# Patient Record
Sex: Female | Born: 1950 | Race: Black or African American | Hispanic: No | Smoking: Former smoker
Health system: Southern US, Community
[De-identification: ages and names within clinical notes are randomized; demographics above are authoritative.]

## PROBLEM LIST (undated history)

## (undated) DIAGNOSIS — E049 Nontoxic goiter, unspecified: Secondary | ICD-10-CM

## (undated) DIAGNOSIS — R112 Nausea with vomiting, unspecified: Secondary | ICD-10-CM

## (undated) DIAGNOSIS — K219 Gastro-esophageal reflux disease without esophagitis: Secondary | ICD-10-CM

## (undated) DIAGNOSIS — T4145XA Adverse effect of unspecified anesthetic, initial encounter: Secondary | ICD-10-CM

## (undated) DIAGNOSIS — D649 Anemia, unspecified: Secondary | ICD-10-CM

## (undated) DIAGNOSIS — I10 Essential (primary) hypertension: Secondary | ICD-10-CM

## (undated) DIAGNOSIS — M1990 Unspecified osteoarthritis, unspecified site: Secondary | ICD-10-CM

## (undated) DIAGNOSIS — Z9889 Other specified postprocedural states: Secondary | ICD-10-CM

## (undated) DIAGNOSIS — C801 Malignant (primary) neoplasm, unspecified: Secondary | ICD-10-CM

## (undated) DIAGNOSIS — T8859XA Other complications of anesthesia, initial encounter: Secondary | ICD-10-CM

## (undated) HISTORY — PX: KNEE SURGERY: SHX244

## (undated) HISTORY — PX: ABDOMINAL HYSTERECTOMY: SHX81

## (undated) HISTORY — PX: HIP SURGERY: SHX245

## (undated) HISTORY — PX: BREAST SURGERY: SHX581

## (undated) HISTORY — PX: COLONOSCOPY: SHX174

## (undated) HISTORY — PX: COLON SURGERY: SHX602

## (undated) HISTORY — PX: BACK SURGERY: SHX140

## (undated) HISTORY — PX: JOINT REPLACEMENT: SHX530

## (undated) HISTORY — PX: MASTECTOMY: SHX3

---

## 2005-06-01 ENCOUNTER — Ambulatory Visit: Payer: Self-pay | Admitting: Oncology

## 2005-06-13 ENCOUNTER — Ambulatory Visit: Payer: Self-pay | Admitting: Oncology

## 2005-08-15 ENCOUNTER — Ambulatory Visit: Payer: Self-pay | Admitting: Oncology

## 2006-11-14 ENCOUNTER — Ambulatory Visit: Payer: Self-pay | Admitting: Oncology

## 2006-12-15 ENCOUNTER — Ambulatory Visit: Payer: Self-pay | Admitting: Oncology

## 2007-01-02 ENCOUNTER — Ambulatory Visit: Payer: Self-pay | Admitting: Oncology

## 2007-01-14 ENCOUNTER — Ambulatory Visit: Payer: Self-pay | Admitting: Oncology

## 2007-09-30 ENCOUNTER — Ambulatory Visit: Payer: Self-pay | Admitting: Oncology

## 2007-10-02 ENCOUNTER — Ambulatory Visit: Payer: Self-pay | Admitting: Oncology

## 2009-05-05 ENCOUNTER — Emergency Department: Payer: Self-pay | Admitting: Emergency Medicine

## 2009-08-09 ENCOUNTER — Encounter: Payer: Self-pay | Admitting: Orthopedic Surgery

## 2009-08-13 ENCOUNTER — Encounter: Payer: Self-pay | Admitting: Orthopedic Surgery

## 2009-09-13 ENCOUNTER — Encounter: Payer: Self-pay | Admitting: Orthopedic Surgery

## 2010-08-08 ENCOUNTER — Encounter: Payer: Self-pay | Admitting: Orthopedic Surgery

## 2010-08-14 ENCOUNTER — Encounter: Payer: Self-pay | Admitting: Orthopedic Surgery

## 2010-09-14 ENCOUNTER — Encounter: Payer: Self-pay | Admitting: Orthopedic Surgery

## 2011-02-18 ENCOUNTER — Other Ambulatory Visit: Payer: Self-pay

## 2011-02-18 LAB — BASIC METABOLIC PANEL
Calcium, Total: 8.5 mg/dL (ref 8.5–10.1)
Chloride: 106 mmol/L (ref 98–107)
Creatinine: 0.84 mg/dL (ref 0.60–1.30)
EGFR (African American): >60
EGFR (Non-African Amer.): >60
Glucose: 101 mg/dL — ABNORMAL HIGH (ref 65–99)
Osmolality: 288 (ref 275–301)
Potassium: 4.5 mmol/L (ref 3.5–5.1)
Sodium: 144 mmol/L (ref 136–145)

## 2011-02-19 ENCOUNTER — Other Ambulatory Visit: Payer: Self-pay

## 2011-02-19 LAB — BASIC METABOLIC PANEL
BUN: 15 mg/dL (ref 7–18)
Chloride: 104 mmol/L (ref 98–107)
Creatinine: 0.77 mg/dL (ref 0.60–1.30)
EGFR (African American): >60
EGFR (Non-African Amer.): >60
Glucose: 83 mg/dL (ref 65–99)
Osmolality: 285 (ref 275–301)
Potassium: 4.4 mmol/L (ref 3.5–5.1)
Sodium: 143 mmol/L (ref 136–145)

## 2011-06-23 ENCOUNTER — Emergency Department: Payer: Self-pay | Admitting: Internal Medicine

## 2011-06-23 LAB — COMPREHENSIVE METABOLIC PANEL
Albumin: 3.2 g/dL — ABNORMAL LOW (ref 3.4–5.0)
Bilirubin,Total: 0.3 mg/dL (ref 0.2–1.0)
Chloride: 104 mmol/L (ref 98–107)
Co2: 30 mmol/L (ref 21–32)
Creatinine: 0.71 mg/dL (ref 0.60–1.30)
EGFR (African American): >60
Osmolality: 279 (ref 275–301)
SGOT(AST): 14 U/L — ABNORMAL LOW (ref 15–37)
SGPT (ALT): 11 U/L — ABNORMAL LOW
Sodium: 141 mmol/L (ref 136–145)

## 2011-06-23 LAB — CBC
HGB: 11 g/dL — ABNORMAL LOW (ref 12.0–16.0)
MCH: 27.4 pg (ref 26.0–34.0)
MCHC: 32.2 g/dL (ref 32.0–36.0)
Platelet: 331 10*3/uL (ref 150–440)

## 2011-06-28 LAB — CULTURE, BLOOD (SINGLE)

## 2012-03-31 ENCOUNTER — Emergency Department: Payer: Self-pay | Admitting: Emergency Medicine

## 2013-01-01 ENCOUNTER — Emergency Department: Payer: Self-pay | Admitting: Emergency Medicine

## 2013-07-18 ENCOUNTER — Ambulatory Visit (INDEPENDENT_AMBULATORY_CARE_PROVIDER_SITE_OTHER): Payer: Medicare PPO | Admitting: Podiatry

## 2013-07-18 ENCOUNTER — Encounter: Payer: Self-pay | Admitting: Podiatry

## 2013-07-18 ENCOUNTER — Ambulatory Visit (INDEPENDENT_AMBULATORY_CARE_PROVIDER_SITE_OTHER): Payer: Medicare PPO

## 2013-07-18 VITALS — BP 141/80 | HR 70 | Resp 16 | Ht 62.0 in | Wt 240.0 lb

## 2013-07-18 DIAGNOSIS — M2040 Other hammer toe(s) (acquired), unspecified foot: Secondary | ICD-10-CM

## 2013-07-18 DIAGNOSIS — M7750 Other enthesopathy of unspecified foot: Secondary | ICD-10-CM

## 2013-07-18 NOTE — Progress Notes (Signed)
Subjective:     Patient ID: Victoria Crane, female   DOB: [birth year], 63 y.o.   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      Objective:   Physical Exam  Nursing note and vitals reviewed. Constitutional: She is oriented to person, place, and time.  Cardiovascular: Intact distal pulses.   Musculoskeletal: Normal range of motion.  Neurological: She is oriented to person, place, and time.  Skin: Skin is dry.   neurovascular status found to be intact with range of motion adequate subtalar and midtarsal joint and muscle strength within normal limits. Patient is found to be well perfused and is noted to have severe flatfoot deformity upon weightbearing and is noted to have keratotic lesion on the lesser digits of both feet. I also noted a low grade inflammation of the metatarsal phalangeal joints    Assessment:     Significant foot structural issues with also the left leg being a quarter inch shorter than the right and hammertoe digital deformities along with capsulitis    Plan:     H&P and x-rays reviewed and today I vitals on orthotics and scanned for custom orthotics to reduce stress against the feet with applying a quarter inch heel left the left one

## 2013-07-18 NOTE — Progress Notes (Signed)
   Subjective:    Patient ID: Victoria Crane, female    DOB: [birth year], 63 y.o.   MRN: [id number]  HPI Comments: My left leg is shorter than my right one. i want to see if i need orthotics or a built up shoe. i was told i have hammer toes on both feet. My feet hurt under the toes and both arch. My feet have been bothering me for yrs. They are getting worse. i take aleve and elevate. They hurt when i stand and walk.  Foot Pain Associated symptoms include fatigue, numbness and weakness.      Review of Systems  Constitutional: Positive for fatigue.  HENT: Positive for sinus pressure and sneezing.   Eyes: Positive for itching.  Cardiovascular:       Calf pain with walking   Musculoskeletal:       Joint pain Back pain Difficulty walking Muscle pain  Allergic/Immunologic: Positive for environmental allergies and food allergies.  Neurological: Positive for weakness and numbness.  Hematological: Bruises/bleeds easily.  Psychiatric/Behavioral: The patient is nervous/anxious.   All other systems reviewed and are negative.      Objective:   Physical Exam        Assessment & Plan:

## 2013-08-05 DIAGNOSIS — M79609 Pain in unspecified limb: Secondary | ICD-10-CM

## 2013-08-06 ENCOUNTER — Encounter: Payer: Self-pay | Admitting: Orthopedic Surgery

## 2013-08-08 ENCOUNTER — Other Ambulatory Visit: Payer: Self-pay

## 2013-08-12 ENCOUNTER — Other Ambulatory Visit: Payer: Self-pay

## 2013-08-13 ENCOUNTER — Encounter: Payer: Self-pay | Admitting: Orthopedic Surgery

## 2013-08-20 ENCOUNTER — Other Ambulatory Visit: Payer: Self-pay

## 2013-09-13 ENCOUNTER — Encounter: Payer: Self-pay | Admitting: Orthopedic Surgery

## 2013-10-17 ENCOUNTER — Ambulatory Visit: Payer: Self-pay | Admitting: Orthopedic Surgery

## 2014-11-17 ENCOUNTER — Emergency Department: Payer: Medicare PPO

## 2014-11-17 ENCOUNTER — Emergency Department
Admission: EM | Admit: 2014-11-17 | Discharge: 2014-11-17 | Disposition: A | Discharge status: Home / Self Care | Payer: Medicare PPO | Attending: Emergency Medicine | Admitting: Emergency Medicine

## 2014-11-17 ENCOUNTER — Encounter: Payer: Self-pay | Admitting: *Deleted

## 2014-11-17 DIAGNOSIS — M542 Cervicalgia: Secondary | ICD-10-CM | POA: Diagnosis present

## 2014-11-17 DIAGNOSIS — Y998 Other external cause status: Secondary | ICD-10-CM | POA: Insufficient documentation

## 2014-11-17 DIAGNOSIS — Y9389 Activity, other specified: Secondary | ICD-10-CM | POA: Diagnosis not present

## 2014-11-17 DIAGNOSIS — M62838 Other muscle spasm: Secondary | ICD-10-CM | POA: Diagnosis not present

## 2014-11-17 DIAGNOSIS — Z79899 Other long term (current) drug therapy: Secondary | ICD-10-CM | POA: Diagnosis not present

## 2014-11-17 DIAGNOSIS — S161XXA Strain of muscle, fascia and tendon at neck level, initial encounter: Secondary | ICD-10-CM

## 2014-11-17 DIAGNOSIS — M79602 Pain in left arm: Secondary | ICD-10-CM | POA: Diagnosis not present

## 2014-11-17 DIAGNOSIS — X58XXXA Exposure to other specified factors, initial encounter: Secondary | ICD-10-CM | POA: Diagnosis not present

## 2014-11-17 DIAGNOSIS — Z87891 Personal history of nicotine dependence: Secondary | ICD-10-CM | POA: Diagnosis not present

## 2014-11-17 DIAGNOSIS — Z7982 Long term (current) use of aspirin: Secondary | ICD-10-CM | POA: Insufficient documentation

## 2014-11-17 DIAGNOSIS — Y9289 Other specified places as the place of occurrence of the external cause: Secondary | ICD-10-CM | POA: Insufficient documentation

## 2014-11-17 DIAGNOSIS — R202 Paresthesia of skin: Secondary | ICD-10-CM

## 2014-11-17 DIAGNOSIS — Z7951 Long term (current) use of inhaled steroids: Secondary | ICD-10-CM | POA: Diagnosis not present

## 2014-11-17 HISTORY — DX: Essential (primary) hypertension: I10

## 2014-11-17 HISTORY — DX: Unspecified osteoarthritis, unspecified site: M19.90

## 2014-11-17 HISTORY — DX: Malignant (primary) neoplasm, unspecified: C80.1

## 2014-11-17 LAB — BASIC METABOLIC PANEL
ANION GAP: 9 (ref 5–15)
BUN: 22 mg/dL — ABNORMAL HIGH (ref 6–20)
CO2: 22 mmol/L (ref 22–32)
Calcium: 9 mg/dL (ref 8.9–10.3)
Chloride: 106 mmol/L (ref 101–111)
Creatinine, Ser: 1 mg/dL (ref 0.44–1.00)
GFR calc Af Amer: >60 mL/min (ref >60)
GFR calc non Af Amer: 59 mL/min — ABNORMAL LOW (ref >60)
GLUCOSE: 71 mg/dL (ref 65–99)
POTASSIUM: 4 mmol/L (ref 3.5–5.1)
Sodium: 137 mmol/L (ref 135–145)

## 2014-11-17 LAB — CBC
HEMATOCRIT: 36.9 % (ref 35.0–47.0)
HEMOGLOBIN: 12.3 g/dL (ref 12.0–16.0)
MCH: 30.4 pg (ref 26.0–34.0)
MCHC: 33.2 g/dL (ref 32.0–36.0)
MCV: 91.7 fL (ref 80.0–100.0)
Platelets: 249 10*3/uL (ref 150–440)
RBC: 4.03 MIL/uL (ref 3.80–5.20)
RDW: 13.2 % (ref 11.5–14.5)
WBC: 6.1 10*3/uL (ref 3.6–11.0)

## 2014-11-17 LAB — TROPONIN I: Troponin I: <0.03 ng/mL (ref <0.031)

## 2014-11-17 MED ORDER — DIAZEPAM 2 MG PO TABS: 2.0000 mg | ORAL_TABLET | Freq: Three times a day (TID) | ORAL | Status: DC | PRN

## 2014-11-17 NOTE — ED Notes (Addendum)
Patient states she has pain when she moves her head back. Pain is to left shoulder, left arm, left chest, left back and states today noticed that her left index finger feels numb. States pain began on Friday. She noticed it at the hairdresser when getting hair washed. Pain has increased every day since then. Denies any chest pain, SOB, N/V. States some pressure to left side of head today.

## 2014-11-17 NOTE — Discharge Instructions (Signed)
Cervical Sprain  A cervical sprain is an injury in the neck in which the strong, fibrous tissues (ligaments) that connect your neck bones stretch or tear. Cervical sprains can range from mild to severe. Severe cervical sprains can cause the neck vertebrae to be unstable. This can lead to damage of the spinal cord and can result in serious nervous system problems. The amount of time it takes for a cervical sprain to get better depends on the cause and extent of the injury. Most cervical sprains heal in 1 to 3 weeks.  CAUSES   Severe cervical sprains may be caused by:    Contact sport injuries (such as from football, rugby, wrestling, hockey, auto racing, gymnastics, diving, martial arts, or boxing).    Motor vehicle collisions.    Whiplash injuries. This is an injury from a sudden forward and backward whipping movement of the head and neck.   Falls.   Mild cervical sprains may be caused by:    Being in an awkward position, such as while cradling a telephone between your ear and shoulder.    Sitting in a chair that does not offer proper support.    Working at a poorly designed computer station.    Looking up or down for long periods of time.   SYMPTOMS    Pain, soreness, stiffness, or a burning sensation in the front, back, or sides of the neck. This discomfort may develop immediately after the injury or slowly, 24 hours or more after the injury.    Pain or tenderness directly in the middle of the back of the neck.    Shoulder or upper back pain.    Limited ability to move the neck.    Headache.    Dizziness.    Weakness, numbness, or tingling in the hands or arms.    Muscle spasms.    Difficulty swallowing or chewing.    Tenderness and swelling of the neck.   DIAGNOSIS   Most of the time your health care provider can diagnose a cervical sprain by taking your history and doing a physical exam. Your health care provider will ask about previous neck injuries and any known neck  problems, such as arthritis in the neck. X-rays may be taken to find out if there are any other problems, such as with the bones of the neck. Other tests, such as a CT scan or MRI, may also be needed.   TREATMENT   Treatment depends on the severity of the cervical sprain. Mild sprains can be treated with rest, keeping the neck in place (immobilization), and pain medicines. Severe cervical sprains are immediately immobilized. Further treatment is done to help with pain, muscle spasms, and other symptoms and may include:   Medicines, such as pain relievers, numbing medicines, or muscle relaxants.    Physical therapy. This may involve stretching exercises, strengthening exercises, and posture training. Exercises and improved posture can help stabilize the neck, strengthen muscles, and help stop symptoms from returning.   HOME CARE INSTRUCTIONS    Put ice on the injured area.     Put ice in a plastic bag.     Place a towel between your skin and the bag.     Leave the ice on for 15-20 minutes, 3-4 times a day.    If your injury was severe, you may have been given a cervical collar to wear. A cervical collar is a two-piece collar designed to keep your neck from moving while it heals.      Do not remove the collar unless instructed by your health care provider.    If you have long hair, keep it outside of the collar.    Ask your health care provider before making any adjustments to your collar. Minor adjustments may be required over time to improve comfort and reduce pressure on your chin or on the back of your head.    Ifyou are allowed to remove the collar for cleaning or bathing, follow your health care provider's instructions on how to do so safely.    Keep your collar clean by wiping it with mild soap and water and drying it completely. If the collar you have been given includes removable pads, remove them every 1-2 days and hand wash them with soap and water. Allow them to air dry. They should be completely  dry before you wear them in the collar.    If you are allowed to remove the collar for cleaning and bathing, wash and dry the skin of your neck. Check your skin for irritation or sores. If you see any, tell your health care provider.    Do not drive while wearing the collar.    Only take over-the-counter or prescription medicines for pain, discomfort, or fever as directed by your health care provider.    Keep all follow-up appointments as directed by your health care provider.    Keep all physical therapy appointments as directed by your health care provider.    Make any needed adjustments to your workstation to promote good posture.    Avoid positions and activities that make your symptoms worse.    Warm up and stretch before being active to help prevent problems.   SEEK MEDICAL CARE IF:    Your pain is not controlled with medicine.    You are unable to decrease your pain medicine over time as planned.    Your activity level is not improving as expected.   SEEK IMMEDIATE MEDICAL CARE IF:    You develop any bleeding.   You develop stomach upset.   You have signs of an allergic reaction to your medicine.    Your symptoms get worse.    You develop new, unexplained symptoms.    You have numbness, tingling, weakness, or paralysis in any part of your body.   MAKE SURE YOU:    Understand these instructions.   Will watch your condition.   Will get help right away if you are not doing well or get worse.     This information is not intended to replace advice given to you by your health care provider. Make sure you discuss any questions you have with your health care provider.     Document Released: 11/27/2006 Document Revised: 02/04/2013 Document Reviewed: 08/07/2012  Elsevier Interactive Patient Education 2016 [employer]

## 2014-11-17 NOTE — ED Notes (Addendum)
Pt reports neck pain, left arm pain and now index finger on left hand feeling numb. Also reports pain to left side of chest intermittently over past few days. Unknown injury. Reports osteoarthritis.

## 2014-11-17 NOTE — ED Provider Notes (Signed)
[facility] Emergency Department Provider Note  ____________________________________________  Time seen: 2135  I have reviewed the triage vital signs and the nursing notes.   HISTORY  Chief Complaint Arm Pain and Numbness     HPI Victoria Crane is a 64 y.o. female who reports she has been having pain in her left neck, into the left shoulder, and today began to have some paresthesia into the tips of the left hand area she initially had the discomfort in her neck and shoulder prior to going to the hair stylist, however she reports that it has become worse since having her hair washed there. She reports she has had some problems similar to this in the past, with a known diagnosis of a bulging disc.  She denies any weakness. She does have intact sensation. She has excellent spontaneous motion of her neck and does not appear to be in any acute distress.   Past Medical History  Diagnosis Date  . Hypertension   . Arthritis   . Cancer ([place])     There are no active problems to display for this patient.   Past Surgical History  Procedure Laterality Date  . Knee surgery Bilateral   . Back surgery    . Hip surgery Left     Current Outpatient Rx  Name  Route  Sig  Dispense  Refill  . aspirin 325 MG tablet   Oral   Take 325 mg by mouth daily.         Marland Kitchen aspirin EC 81 MG tablet   Oral   Take 81 mg by mouth daily.         . cetirizine (ZYRTEC) 10 MG tablet   Oral   Take 10 mg by mouth as needed for allergies.         . diazepam (VALIUM) 2 MG tablet   Oral   Take 1 tablet (2 mg total) by mouth every 8 (eight) hours as needed for anxiety or muscle spasms.   20 tablet   0   . diazepam (VALIUM) 5 MG tablet   Oral   Take 5 mg by mouth as needed for anxiety.         . diclofenac (VOLTAREN) 75 MG EC tablet               . dicyclomine (BENTYL) 10 MG capsule               . famotidine (PEPCID) 40 MG tablet               .  FLUoxetine (PROZAC) 20 MG tablet               . fluticasone (FLONASE) 50 MCG/ACT nasal spray   Each Nare   Place into both nostrils daily.         Marland Kitchen gabapentin (NEURONTIN) 600 MG tablet               . hydrochlorothiazide (HYDRODIURIL) 25 MG tablet               . Hydrocodone-Acetaminophen 5-300 MG TABS   Oral   Take by mouth as needed.           Allergies Augmentin; Codeine; Darvon; Lipitor; Niacin and related; Reglan; Zocor; Ciprofloxacin; Indocin; Iodinated diagnostic agents; and Sulfa antibiotics  No family history on file.  Social History Social History  Substance Use Topics  . Smoking status: Former Research scientist (life sciences)  . Smokeless tobacco: None  . Alcohol  Use: Yes    Review of Systems  Constitutional: Negative for fever. ENT: Negative for sore throat. Cardiovascular: Negative for chest pain. Respiratory: Negative for cough. Gastrointestinal: Negative for abdominal pain, vomiting and diarrhea. Genitourinary: Negative for dysuria. Musculoskeletal: Pain in left neck and shoulder. See history of present illness. Skin: Negative for rash. Neurological: Positive for paresthesia into the first and second digit of the left hand.   10-point ROS otherwise negative.  ____________________________________________   PHYSICAL EXAM:  VITAL SIGNS: ED Triage Vitals  Enc Vitals Group     BP 11/17/14 1913 150/74 mmHg     Pulse Rate 11/17/14 1913 75     Resp 11/17/14 1913 18     Temp 11/17/14 1913 98.3 F (36.8 C)     Temp Source 11/17/14 1913 Oral     SpO2 11/17/14 1913 99 %     Weight 11/17/14 1913 235 lb (106.595 kg)     Height 11/17/14 1913 5\' 3"  (1.6 m)     Head Cir --      Peak Flow --      Pain Score 11/17/14 1913 4     Pain Loc --      Pain Edu? --      Excl. in [place]? --     Constitutional:  Alert and oriented. Well appearing and in no distress. ENT   Head: Normocephalic and atraumatic.   Nose: No congestion/rhinnorhea.    Cervical: No midline  tenderness. Minimal discomfort in the left paraspinal muscles radiating through the trapezius in the left shoulder. No pain on the right. Cardiovascular: Normal rate, regular rhythm, no murmur noted Respiratory:  Normal respiratory effort, no tachypnea.    Breath sounds are clear and equal bilaterally.  Gastrointestinal: Soft and nontender. No distention.  Back: No muscle spasm, no tenderness, no CVA tenderness. Musculoskeletal: No deformity noted. Nontender with normal range of motion in all extremities.  No noted edema. Neurologic:  Normal speech and language.  Grip strength in both hands. Intrinsic muscles in both hands are intact and equal. Sensation is equal and intact in both hands. No gross focal neurologic deficits are appreciated.  Skin:  Skin is warm, dry. No rash noted. Psychiatric: Mood and affect are normal. Very pleasant individual area Speech and behavior are normal.  ____________________________________________    LABS (pertinent positives/negatives)  Labs Reviewed  BASIC METABOLIC PANEL - Abnormal; Notable for the following:    BUN 22 (*)    GFR calc non Af Amer 59 (*)    All other components within normal limits  CBC  TROPONIN I     ____________________________________________   EKG  ED ECG REPORT I, Kaydynce Pat W, the attending physician, personally viewed and interpreted this ECG.   Date: 11/17/2014  EKG Time: 1921  Rate: 72  Rhythm: Normal sinus rhythm  Axis: Normal  Intervals: Normal  ST&T Change: None noted   ____________________________________________    RADIOLOGY  Chest x-ray  IMPRESSION: 1. No consolidations. 2. Minimal atelectasis or scarring bilaterally.  ____________________________________________ ____________________________________________   INITIAL IMPRESSION / ASSESSMENT AND PLAN / ED COURSE  Pertinent labs & imaging results that were available during my care of the patient were reviewed by me and considered in my  medical decision making (see chart for details).   Well-appearing pleasant 64 year old female in no acute distress. She has mild tenderness through the left neck into the left trapezius. She has good range of motion of both her neck and her left arm. She has intact sensation  and equal grip strength in both hands. I believe she has a minor strain of the left trapezius and neck. There is a small possibility that the symptoms could be due to a herniated disc, but without any focal deficits, no imaging or further testing is indicated currently. She does have cyclobenzaprine at home but says it does not help. I discussed with her the option of using Valium, and we'll prescribe 2 mg tablets for 2 trial. I've explained to the patient that primarily more time in gentle motion is the treatment of choice. If not better, she will need to follow-up with orthopedic doctor at [facility]. At that point, further imaging may be indicated, including MRI.  ____________________________________________   FINAL CLINICAL IMPRESSION(S) / ED DIAGNOSES  Final diagnoses:  Cervical strain, acute, initial encounter  Trapezius muscle spasm  Paresthesia      Ahmed Prima, MD 11/17/14 2158

## 2015-03-25 ENCOUNTER — Emergency Department
Admission: EM | Admit: 2015-03-25 | Discharge: 2015-03-25 | Disposition: A | Discharge status: Home / Self Care | Payer: Medicare PPO | Attending: Emergency Medicine | Admitting: Emergency Medicine

## 2015-03-25 ENCOUNTER — Emergency Department: Payer: Medicare PPO

## 2015-03-25 ENCOUNTER — Encounter: Payer: Self-pay | Admitting: Emergency Medicine

## 2015-03-25 DIAGNOSIS — I10 Essential (primary) hypertension: Secondary | ICD-10-CM | POA: Insufficient documentation

## 2015-03-25 DIAGNOSIS — C801 Malignant (primary) neoplasm, unspecified: Secondary | ICD-10-CM | POA: Diagnosis not present

## 2015-03-25 DIAGNOSIS — R0789 Other chest pain: Secondary | ICD-10-CM | POA: Insufficient documentation

## 2015-03-25 DIAGNOSIS — Z79899 Other long term (current) drug therapy: Secondary | ICD-10-CM | POA: Insufficient documentation

## 2015-03-25 DIAGNOSIS — Z7951 Long term (current) use of inhaled steroids: Secondary | ICD-10-CM | POA: Diagnosis not present

## 2015-03-25 DIAGNOSIS — Z87891 Personal history of nicotine dependence: Secondary | ICD-10-CM | POA: Diagnosis not present

## 2015-03-25 DIAGNOSIS — R079 Chest pain, unspecified: Secondary | ICD-10-CM | POA: Diagnosis present

## 2015-03-25 DIAGNOSIS — R0781 Pleurodynia: Secondary | ICD-10-CM

## 2015-03-25 LAB — BASIC METABOLIC PANEL
ANION GAP: 9 (ref 5–15)
BUN: 13 mg/dL (ref 6–20)
CALCIUM: 8.8 mg/dL — AB (ref 8.9–10.3)
CO2: 25 mmol/L (ref 22–32)
CREATININE: 0.81 mg/dL (ref 0.44–1.00)
Chloride: 106 mmol/L (ref 101–111)
Glucose, Bld: 93 mg/dL (ref 65–99)
Potassium: 4 mmol/L (ref 3.5–5.1)
SODIUM: 140 mmol/L (ref 135–145)

## 2015-03-25 LAB — FIBRIN DERIVATIVES D-DIMER (ARMC ONLY): FIBRIN DERIVATIVES D-DIMER (ARMC): 4720 — AB (ref 0–499)

## 2015-03-25 LAB — CBC
HCT: 37.8 % (ref 35.0–47.0)
Hemoglobin: 12.7 g/dL (ref 12.0–16.0)
MCH: 30 pg (ref 26.0–34.0)
MCHC: 33.7 g/dL (ref 32.0–36.0)
MCV: 89 fL (ref 80.0–100.0)
PLATELETS: 227 10*3/uL (ref 150–440)
RBC: 4.25 MIL/uL (ref 3.80–5.20)
RDW: 13.8 % (ref 11.5–14.5)
WBC: 5.2 10*3/uL (ref 3.6–11.0)

## 2015-03-25 LAB — TROPONIN I

## 2015-03-25 MED ORDER — TECHNETIUM TO 99M ALBUMIN AGGREGATED
3.0000 | Freq: Once | INTRAVENOUS | Status: AC | PRN
  Administered 2015-03-25: 3.52 via INTRAVENOUS

## 2015-03-25 MED ORDER — TECHNETIUM TC 99M DIETHYLENETRIAME-PENTAACETIC ACID
32.3700 | Freq: Once | INTRAVENOUS | Status: AC | PRN
  Administered 2015-03-25: 32.37 via INTRAVENOUS

## 2015-03-25 NOTE — ED Notes (Signed)
Pt transported to nuclear medicine. 

## 2015-03-25 NOTE — Discharge Instructions (Signed)
You have been seen in the Emergency Department (ED) today for chest pain.  As we have discussed today’s test results are normal, but you may require further testing. ° °Please follow up with the recommended doctor as instructed above in these documents regarding today’s emergent visit and your recent symptoms to discuss further management.  Continue to take your regular medications. If you are not doing so already, please also take a daily baby aspirin (81 mg), at least until you follow up with your doctor. ° °Return to the Emergency Department (ED) if you experience any further chest pain/pressure/tightness, difficulty breathing, or sudden sweating, or other symptoms that concern you. ° ° °Chest Pain Observation °It is often hard to give a specific diagnosis for the cause of chest pain. Among other possibilities your symptoms might be caused by inadequate oxygen delivery to your heart (angina). Angina that is not treated or evaluated can lead to a heart attack (myocardial infarction) or death. °Blood tests, electrocardiograms, and X-rays may have been done to help determine a possible cause of your chest pain. After evaluation and observation, your health care provider has determined that it is unlikely your pain was caused by an unstable condition that requires hospitalization. However, a full evaluation of your pain may need to be completed, with additional diagnostic testing as directed. It is very important to keep your follow-up appointments. Not keeping your follow-up appointments could result in permanent heart damage, disability, or death. If there is any problem keeping your follow-up appointments, you must call your health care provider. °HOME CARE INSTRUCTIONS  °Due to the slight chance that your pain could be angina, it is important to follow your health care provider's treatment plan and also maintain a healthy lifestyle: °· Maintain or work toward achieving a healthy weight. °· Stay physically active  and exercise regularly. °· Decrease your salt intake. °· Eat a balanced, healthy diet. Talk to a dietitian to learn about heart-healthy foods. °· Increase your fiber intake by including whole grains, vegetables, fruits, and nuts in your diet. °· Avoid situations that cause stress, anger, or depression. °· Take medicines as advised by your health care provider. Report any side effects to your health care provider. Do not stop medicines or adjust the dosages on your own. °· Quit smoking. Do not use nicotine patches or gum until you check with your health care provider. °· Keep your blood pressure, blood sugar, and cholesterol levels within normal limits. °· Limit alcohol intake to no more than 1 drink per day for women who are not pregnant and 2 drinks per day for men. °· Do not abuse drugs. °SEEK IMMEDIATE MEDICAL CARE IF: °You have severe chest pain or pressure which may include symptoms such as: °· You feel pain or pressure in your arms, neck, jaw, or back. °· You have severe back or abdominal pain, feel sick to your stomach (nauseous), or throw up (vomit). °· You are sweating profusely. °· You are having a fast or irregular heartbeat. °· You feel short of breath while at rest. °· You notice increasing shortness of breath during rest, sleep, or with activity. °· You have chest pain that does not get better after rest or after taking your usual medicine. °· You wake from sleep with chest pain. °· You are unable to sleep because you cannot breathe. °· You develop a frequent cough or you are coughing up blood. °· You feel dizzy, faint, or experience extreme fatigue. °· You develop severe weakness, dizziness, fainting,   or chills. °Any of these symptoms may represent a serious problem that is an emergency. Do not wait to see if the symptoms will go away. Call your local emergency services (911 in the [place] Do not drive yourself to the hospital. °MAKE SURE YOU: °· Understand these instructions. °· Will watch your  condition. °· Will get help right away if you are not doing well or get worse. °  °This information is not intended to replace advice given to you by your health care provider. Make sure you discuss any questions you have with your health care provider. °  °Document Released: 03/04/2010 Document Revised: 02/04/2013 Document Reviewed: 08/01/2012 °Elsevier Interactive Patient Education ©2016 [employer] ° °

## 2015-03-25 NOTE — ED Provider Notes (Signed)
[facility] Emergency Department Provider Note  ____________________________________________  Time seen: Approximately 9:00 AM  I have reviewed the triage vital signs and the nursing notes.   HISTORY  Chief Complaint Chest Pain    HPI Victoria Crane is a 65 y.o. female evidence for evaluation of intermittent pain in the left chest.  Patient reports that since about Sunday night she is been experiencing occasional sharp twinges of pain under the left breast.  She denies any shortness of breath or trouble breathing. No heavy chest pressure. The pain has been off and on for couple of days and does not seem to change with exertion.   No nausea or vomiting. There is a history of high blood pressure. No recent surgeries. Does have a history of cancer.   Past Medical History  Diagnosis Date  . Hypertension   . Arthritis   . Cancer ([place])     There are no active problems to display for this patient.   Past Surgical History  Procedure Laterality Date  . Knee surgery Bilateral   . Back surgery    . Hip surgery Left   . Breast surgery      Current Outpatient Rx  Name  Route  Sig  Dispense  Refill  . cetirizine (ZYRTEC) 10 MG tablet   Oral   Take 10 mg by mouth daily.          . diclofenac (VOLTAREN) 75 MG EC tablet   Oral   Take 1 tablet by mouth 2 (two) times daily.         . famotidine (PEPCID) 40 MG tablet   Oral   Take 1 tablet by mouth 2 (two) times daily.         Marland Kitchen FLUoxetine (PROZAC) 20 MG tablet   Oral   Take 40 mg by mouth daily.          . fluticasone (FLONASE) 50 MCG/ACT nasal spray   Each Nare   Place 1-2 sprays into both nostrils daily as needed.         . gabapentin (NEURONTIN) 600 MG tablet   Oral   Take 300 mg by mouth 2 (two) times daily.          . hydrochlorothiazide (HYDRODIURIL) 25 MG tablet   Oral   Take 12.5 mg by mouth daily.          . IRON PO   Oral   Take 1 tablet by mouth daily.         . pravastatin (PRAVACHOL) 10 MG tablet   Oral   Take 10 mg by mouth daily.           Allergies Augmentin; Codeine; Darvon; Lipitor; Niacin and related; Reglan; Zocor; Ciprofloxacin; Indocin; Iodinated diagnostic agents; and Sulfa antibiotics  No family history on file.  Social History Social History  Substance Use Topics  . Smoking status: Former Research scientist (life sciences)  . Smokeless tobacco: None  . Alcohol Use: Yes    Review of Systems Constitutional: No fever/chills Eyes: No visual changes. ENT: No sore throat. Cardiovascular: See history of present illness Respiratory: Denies shortness of breath. Gastrointestinal: No abdominal pain.  No nausea, no vomiting.  No diarrhea.  No constipation. Genitourinary: Negative for dysuria. Musculoskeletal: Negative for back pain. Skin: Negative for rash. Neurological: Negative for headaches, focal weakness or numbness.  Denies history of any heart problems other than high blood pressure.  10-point ROS otherwise negative.  ____________________________________________   PHYSICAL EXAM:  VITAL SIGNS:  ED Triage Vitals  Enc Vitals Group     BP 03/25/15 0816 148/80 mmHg     Pulse Rate 03/25/15 0816 84     Resp 03/25/15 0816 20     Temp 03/25/15 0816 97.8 F (36.6 C)     Temp Source 03/25/15 0816 Oral     SpO2 03/25/15 0816 94 %     Weight 03/25/15 0816 236 lb (107.049 kg)     Height 03/25/15 0816 5\' 3"  (1.6 m)     Head Cir --      Peak Flow --      Pain Score 03/25/15 0817 3     Pain Loc --      Pain Edu? --      Excl. in [place]? --    Constitutional: Alert and oriented. Well appearing and in no acute distress. Eyes: Conjunctivae are normal. PERRL. EOMI. Head: Atraumatic. Nose: No congestion/rhinnorhea. Mouth/Throat: Mucous membranes are moist.  Oropharynx non-erythematous. Neck: No stridor.   Cardiovascular: Normal rate, regular rhythm. Grossly normal heart sounds.  Good peripheral circulation. Respiratory: Normal respiratory effort.   No retractions. Lungs CTAB. Patient does note discomfort and comes a sharp pain located in the region of the left breast when she takes a deep breath. She also states this seems worse with lifting her arms above her head. Gastrointestinal: Soft and nontender. No distention. No abdominal bruits. No CVA tenderness. Musculoskeletal: No lower extremity tenderness nor edema.  No joint effusions. Neurologic:  Normal speech and language. No gross focal neurologic deficits are appreciated. Skin:  Skin is warm, dry and intact. No rash noted. Psychiatric: Mood and affect are normal. Speech and behavior are normal.  ____________________________________________   LABS (all labs ordered are listed, but only abnormal results are displayed)  Labs Reviewed  BASIC METABOLIC PANEL - Abnormal; Notable for the following:    Calcium 8.8 (*)    All other components within normal limits  FIBRIN DERIVATIVES D-DIMER ([facility] ONLY) - Abnormal; Notable for the following:    Fibrin derivatives D-dimer [facility]) 4720 (*)    All other components within normal limits  TROPONIN I  CBC   ____________________________________________  EKG  ED ECG REPORT I, Ivaan Liddy, the attending physician, personally viewed and interpreted this ECG.  Date: 03/25/2015 EKG Time: 820 Rate: 80 Rhythm: normal sinus rhythm QRS Axis: normal Intervals: normal ST/T Wave abnormalities: normal Conduction Disturbances: none Narrative Interpretation: unremarkable  ____________________________________________  RADIOLOGY      DG Chest 2 View (Final result) Result time: 03/25/15 08:39:13   Final result by Rad Results In Interface (03/25/15 08:39:13)   Narrative:   CLINICAL DATA: Chest pain for the past 4 days centered of the left breast; history of right-sided mastectomy with saline implant in 1995; former smoker  EXAM: CHEST 2 VIEW  COMPARISON: PA and lateral chest x-ray of November 17, 2014  FINDINGS: The lungs are  adequately inflated. There is no focal infiltrate. The interstitial markings on the right are coarse but stable. There is no pleural effusion or pneumothorax. The heart and pulmonary vascularity are normal. The mediastinum is normal in width. There is mild multilevel degenerative disc disease of the thoracic spine.  IMPRESSION: There is no active cardiopulmonary disease. No acute chest wall abnormality is demonstrated either. If the patient's symptoms persist and remain unexplained, chest CT scanning may be a useful next imaging step.   Electronically Signed By: David Martinique M.D. On: 03/25/2015 08:39    ____________________________________________   PROCEDURES  Procedure(s) performed:  None  Critical Care performed: No  ____________________________________________   INITIAL IMPRESSION / ASSESSMENT AND PLAN / ED COURSE  Pertinent labs & imaging results that were available during my care of the patient were reviewed by me and considered in my medical decision making (see chart for details).  Patient returns for evaluation of left-sided pleuritic pain. EKG and troponin are reassuring. Patient is awake alert in no distress.  Symptoms are atypical ACS. Symptoms have been ongoing for several days pleuritic and somewhat reproducible musculoskeletal component. EKG normal. Based on the patient's previous history with a history of cancer, in addition the patient has a history of difficulty tolerating high doses of steroids per her report to me, I ordered a V/Q scan to exclude pulmonary embolism. No signs or symptoms to suggest acute dissection, aneurysm, pneumothorax.  ----------------------------------------- 3:54 PM on 03/25/2015 -----------------------------------------  Ongoing care and disposition assigned Dr. Archie Balboa. Follow-up on VQ scan. If negative would advise discharge follow-up with PCP and cardiology. Felt to be unlikely acute coronary syndrome, but patient is felt  to be at risk for pulmonary embolus with d-dimer greater than 4000 and a previous history of cancer. ____________________________________________   FINAL CLINICAL IMPRESSION(S) / ED DIAGNOSES  Final diagnoses:  Pleuritic pain      Delman Kitten, MD 03/25/15 1555

## 2015-03-25 NOTE — ED Notes (Signed)
Pt reports taking one 150mg  of prednisone on Sunday night, due to CT contrast. Pt reports she's been weaned off her neurotin also. Reports left sided chest pain under left breast, some back pain.

## 2019-09-05 ENCOUNTER — Encounter
Admission: RE | Admit: 2019-09-05 | Discharge: 2019-09-05 | Disposition: A | Discharge status: Home / Self Care | Payer: Medicare PPO | Source: Ambulatory Visit | Attending: Orthopedic Surgery | Admitting: Orthopedic Surgery

## 2019-09-05 ENCOUNTER — Other Ambulatory Visit: Payer: Self-pay | Admitting: Orthopedic Surgery

## 2019-09-05 ENCOUNTER — Other Ambulatory Visit: Payer: Self-pay

## 2019-09-05 DIAGNOSIS — I10 Essential (primary) hypertension: Secondary | ICD-10-CM | POA: Diagnosis not present

## 2019-09-05 DIAGNOSIS — Z01818 Encounter for other preprocedural examination: Secondary | ICD-10-CM | POA: Diagnosis present

## 2019-09-05 HISTORY — DX: Nontoxic goiter, unspecified: E04.9

## 2019-09-05 HISTORY — DX: Gastro-esophageal reflux disease without esophagitis: K21.9

## 2019-09-05 HISTORY — DX: Anemia, unspecified: D64.9

## 2019-09-05 HISTORY — DX: Other complications of anesthesia, initial encounter: T88.59XA

## 2019-09-05 HISTORY — DX: Nausea with vomiting, unspecified: R11.2

## 2019-09-05 HISTORY — DX: Other specified postprocedural states: Z98.890

## 2019-09-05 LAB — URINALYSIS, ROUTINE W REFLEX MICROSCOPIC
Bacteria, UA: NONE SEEN
Bilirubin Urine: NEGATIVE
Glucose, UA: NEGATIVE mg/dL
Hgb urine dipstick: NEGATIVE
Ketones, ur: 5 mg/dL — AB
Nitrite: NEGATIVE
Protein, ur: 30 mg/dL — AB
Specific Gravity, Urine: 1.024 (ref 1.005–1.030)
Squamous Epithelial / HPF: NONE SEEN (ref 0–5)
pH: 5 (ref 5.0–8.0)

## 2019-09-05 LAB — PROTIME-INR
INR: 1 (ref 0.8–1.2)
Prothrombin Time: 12.4 seconds (ref 11.4–15.2)

## 2019-09-05 LAB — CBC
HCT: 37.1 % (ref 36.0–46.0)
Hemoglobin: 12.2 g/dL (ref 12.0–15.0)
MCH: 30.3 pg (ref 26.0–34.0)
MCHC: 32.9 g/dL (ref 30.0–36.0)
MCV: 92.1 fL (ref 80.0–100.0)
Platelets: 278 10*3/uL (ref 150–400)
RBC: 4.03 MIL/uL (ref 3.87–5.11)
RDW: 13.1 % (ref 11.5–15.5)
WBC: 6.7 10*3/uL (ref 4.0–10.5)
nRBC: 0 % (ref 0.0–0.2)

## 2019-09-05 LAB — SURGICAL PCR SCREEN
MRSA, PCR: NEGATIVE
Staphylococcus aureus: NEGATIVE

## 2019-09-05 LAB — BASIC METABOLIC PANEL
Anion gap: 13 (ref 5–15)
BUN: 13 mg/dL (ref 8–23)
CO2: 25 mmol/L (ref 22–32)
Calcium: 9.4 mg/dL (ref 8.9–10.3)
Chloride: 101 mmol/L (ref 98–111)
Creatinine, Ser: 1.01 mg/dL — ABNORMAL HIGH (ref 0.44–1.00)
GFR calc Af Amer: >60 mL/min (ref >60)
GFR calc non Af Amer: 57 mL/min — ABNORMAL LOW (ref >60)
Glucose, Bld: 89 mg/dL (ref 70–99)
Potassium: 4.1 mmol/L (ref 3.5–5.1)
Sodium: 139 mmol/L (ref 135–145)

## 2019-09-05 LAB — APTT: aPTT: 25 seconds (ref 24–36)

## 2019-09-05 NOTE — Patient Instructions (Addendum)
Your procedure is scheduled on: 09-17-19 [facility] Report to Same Day Surgery 2nd floor medical mall [facility] Entrance-take elevator on left to 2nd floor.  Check in with surgery information desk.) To find out your arrival time please call [phone] between 1PM - 3PM on 09-16-19 TUESDAY  Remember: Instructions that are not followed completely may result in serious medical risk, up to and including death, or upon the discretion of your surgeon and anesthesiologist your surgery may need to be rescheduled.    _x___ 1. Do not eat food after midnight the night before your procedure. NO GUM OR CANDY AFTER MIDNIGHT. You may drink clear liquids up to 2 hours before you are scheduled to arrive at the hospital for your procedure.  Do not drink clear liquids within 2 hours of your scheduled arrival to the hospital.  Clear liquids include  --Water or Apple juice without pulp  --Gatorade  --Black Coffee or Clear Tea (No milk, no creamers, do not add anything to the coffee or Tea-OK TO ADD SUGAR)    __x__ 2. No Alcohol for 24 hours before or after surgery.   __x__3. No Smoking or e-cigarettes for 24 prior to surgery.  Do not use any chewable tobacco products for at least 6 hour prior to surgery   ____  4. Bring all medications with you on the day of surgery if instructed.    __x__ 5. Notify your doctor if there is any change in your medical condition     (cold, fever, infections).    x___6. On the morning of surgery brush your teeth with toothpaste and water.  You may rinse your mouth with mouth wash if you wish.  Do not swallow any toothpaste or mouthwash.   Do not wear jewelry, make-up, hairpins, clips or nail polish.  Do not wear lotions, powders, or perfumes.   Do not shave 48 hours prior to surgery. Men may shave face and neck.  Do not bring valuables to the hospital.    [facility] is not responsible for any belongings or valuables.               Contacts, dentures or bridgework may not  be worn into surgery.  Leave your suitcase in the car. After surgery it may be brought to your room.  For patients admitted to the hospital, discharge time is determined by your treatment team.  _  Patients discharged the day of surgery will not be allowed to drive home.  You will need someone to drive you home and stay with you the night of your procedure.    Please read over the following fact sheets that you were given:   [facility] Preparing for Surgery and MRSA Information/INCENTIVE SPIROMETER INSTRUCTIONS  _x___ TAKE THE FOLLOWING MEDICATION THE MORNING OF SURGERY WITH A SMALL SIP OF WATER. These include:     1. GABAPENTIN (NEURONTIN)   2.PRILOSEC (OMEPRAZOLE)   3.TAKE AN EXTRA PRILOSEC THE NIGHT BEFORE YOUR SURGERY    ____Fleets enema or Magnesium Citrate as directed.   _x___ Use CHG Soap as directed on instruction sheet   ____ Use inhalers on the day of surgery and bring to hospital day of surgery  ____ Stop Metformin and Janumet 2 days prior to surgery.    ____ Take 1/2 of usual insulin dose the night before surgery and none on the morning  surgery.   ____ Follow recommendations from Cardiologist, Pulmonologist or PCP regarding stopping Aspirin, Coumadin, Plavix ,Eliquis, Effient, or Pradaxa, and  Pletal.  X____Stop Anti-inflammatories such as Advil, Aleve, Ibuprofen, Motrin, Naproxen, DICLOFENAC (VOLTAREN) Naprosyn, Goodies powders or aspirin products 7 DAYS PRIOR TO SURGERY-OK to take Tylenol OR HYDROCODONE IF NEEDED   _x___ Stop supplements until after surgery-STOP YOUR VITAMIN C AND E 7 DAYS PRIOR TO SURGERY-YOU MAY RESUME AFTER SURGERY   ____ Bring C-Pap to the hospital.

## 2019-09-15 ENCOUNTER — Other Ambulatory Visit: Payer: Self-pay

## 2019-09-15 ENCOUNTER — Other Ambulatory Visit
Admission: RE | Admit: 2019-09-15 | Discharge: 2019-09-15 | Disposition: A | Discharge status: Home / Self Care | Payer: Medicare PPO | Source: Ambulatory Visit | Attending: Orthopedic Surgery | Admitting: Orthopedic Surgery

## 2019-09-15 DIAGNOSIS — Z20822 Contact with and (suspected) exposure to covid-19: Secondary | ICD-10-CM | POA: Diagnosis not present

## 2019-09-15 DIAGNOSIS — Z01812 Encounter for preprocedural laboratory examination: Secondary | ICD-10-CM | POA: Insufficient documentation

## 2019-09-16 LAB — SARS CORONAVIRUS 2 (TAT 6-24 HRS): SARS Coronavirus 2: NEGATIVE

## 2019-09-17 ENCOUNTER — Ambulatory Visit: Payer: Medicare PPO | Admitting: Urgent Care

## 2019-09-17 ENCOUNTER — Observation Stay
Admission: RE | Admit: 2019-09-17 | Discharge: 2019-09-19 | Disposition: A | Discharge status: Home / Self Care | Payer: Medicare PPO | Attending: Orthopedic Surgery | Admitting: Orthopedic Surgery

## 2019-09-17 ENCOUNTER — Ambulatory Visit: Payer: Medicare PPO

## 2019-09-17 ENCOUNTER — Other Ambulatory Visit: Payer: Self-pay

## 2019-09-17 ENCOUNTER — Encounter: Payer: Self-pay | Admitting: Orthopedic Surgery

## 2019-09-17 ENCOUNTER — Encounter
Admission: RE | Disposition: A | Discharge status: Home / Self Care | Payer: Self-pay | Source: Home / Self Care | Attending: Orthopedic Surgery

## 2019-09-17 DIAGNOSIS — M1611 Unilateral primary osteoarthritis, right hip: Secondary | ICD-10-CM | POA: Diagnosis not present

## 2019-09-17 DIAGNOSIS — Z79899 Other long term (current) drug therapy: Secondary | ICD-10-CM | POA: Insufficient documentation

## 2019-09-17 DIAGNOSIS — Z7982 Long term (current) use of aspirin: Secondary | ICD-10-CM | POA: Insufficient documentation

## 2019-09-17 DIAGNOSIS — Z853 Personal history of malignant neoplasm of breast: Secondary | ICD-10-CM | POA: Diagnosis not present

## 2019-09-17 DIAGNOSIS — I10 Essential (primary) hypertension: Secondary | ICD-10-CM | POA: Insufficient documentation

## 2019-09-17 DIAGNOSIS — Z96641 Presence of right artificial hip joint: Secondary | ICD-10-CM

## 2019-09-17 DIAGNOSIS — Z419 Encounter for procedure for purposes other than remedying health state, unspecified: Secondary | ICD-10-CM

## 2019-09-17 HISTORY — PX: TOTAL HIP ARTHROPLASTY: SHX124

## 2019-09-17 LAB — ABO/RH: ABO/RH(D): B POS

## 2019-09-17 SURGERY — ARTHROPLASTY, HIP, TOTAL, ANTERIOR APPROACH
Anesthesia: General | Site: Hip | Laterality: Right

## 2019-09-17 MED ORDER — LIDOCAINE HCL (PF) 2 % IJ SOLN
INTRAMUSCULAR | Status: AC
  Filled 2019-09-17: qty 5

## 2019-09-17 MED ORDER — ONDANSETRON HCL 4 MG/2ML IJ SOLN
INTRAMUSCULAR | Status: DC | PRN
  Administered 2019-09-17: 4 mg via INTRAVENOUS

## 2019-09-17 MED ORDER — PROPOFOL 10 MG/ML IV BOLUS
INTRAVENOUS | Status: DC | PRN
  Administered 2019-09-17: 20 mg via INTRAVENOUS
  Administered 2019-09-17: 150 mg via INTRAVENOUS
  Administered 2019-09-17: 30 mg via INTRAVENOUS

## 2019-09-17 MED ORDER — DEXAMETHASONE SODIUM PHOSPHATE 10 MG/ML IJ SOLN
INTRAMUSCULAR | Status: AC
  Filled 2019-09-17: qty 1

## 2019-09-17 MED ORDER — SODIUM CHLORIDE (PF) 0.9 % IJ SOLN
INTRAMUSCULAR | Status: AC
  Filled 2019-09-17: qty 10

## 2019-09-17 MED ORDER — BISACODYL 10 MG RE SUPP: 10.0000 mg | Freq: Every day | RECTAL | Status: DC | PRN

## 2019-09-17 MED ORDER — KETAMINE HCL 50 MG/ML IJ SOLN
INTRAMUSCULAR | Status: DC | PRN
Start: 2019-09-17 — End: 2019-09-17
  Administered 2019-09-17: 50 mg via INTRAMUSCULAR

## 2019-09-17 MED ORDER — CEFAZOLIN SODIUM-DEXTROSE 2-4 GM/100ML-% IV SOLN
2.0000 g | Freq: Four times a day (QID) | INTRAVENOUS | Status: AC
  Administered 2019-09-17 (×2): 2 g via INTRAVENOUS
  Filled 2019-09-17 (×2): qty 100

## 2019-09-17 MED ORDER — BUPIVACAINE-EPINEPHRINE (PF) 0.25% -1:200000 IJ SOLN
INTRAMUSCULAR | Status: DC | PRN
  Administered 2019-09-17: 30 mL

## 2019-09-17 MED ORDER — MENTHOL 3 MG MT LOZG
1.0000 | LOZENGE | OROMUCOSAL | Status: DC | PRN
  Filled 2019-09-17: qty 9

## 2019-09-17 MED ORDER — FENTANYL CITRATE (PF) 100 MCG/2ML IJ SOLN
INTRAMUSCULAR | Status: AC
  Filled 2019-09-17: qty 2

## 2019-09-17 MED ORDER — CLINDAMYCIN PHOSPHATE 900 MG/50ML IV SOLN
900.0000 mg | INTRAVENOUS | Status: AC
  Administered 2019-09-17: 900 mg via INTRAVENOUS

## 2019-09-17 MED ORDER — LIDOCAINE HCL (CARDIAC) PF 100 MG/5ML IV SOSY
PREFILLED_SYRINGE | INTRAVENOUS | Status: DC | PRN
  Administered 2019-09-17: 100 mg via INTRAVENOUS

## 2019-09-17 MED ORDER — ORAL CARE MOUTH RINSE: 15.0000 mL | Freq: Once | OROMUCOSAL | Status: AC

## 2019-09-17 MED ORDER — MORPHINE SULFATE (PF) 2 MG/ML IV SOLN: 0.5000 mg | INTRAVENOUS | Status: DC | PRN

## 2019-09-17 MED ORDER — EZETIMIBE 10 MG PO TABS
10.0000 mg | ORAL_TABLET | Freq: Every day | ORAL | Status: DC
  Administered 2019-09-17 – 2019-09-18 (×2): 10 mg via ORAL
  Filled 2019-09-17 (×3): qty 1

## 2019-09-17 MED ORDER — LACTATED RINGERS IV SOLN: INTRAVENOUS | Status: DC

## 2019-09-17 MED ORDER — SUCCINYLCHOLINE CHLORIDE 200 MG/10ML IV SOSY
PREFILLED_SYRINGE | INTRAVENOUS | Status: AC
  Filled 2019-09-17: qty 10

## 2019-09-17 MED ORDER — SUGAMMADEX SODIUM 200 MG/2ML IV SOLN
INTRAVENOUS | Status: DC | PRN
  Administered 2019-09-17: 200 mg via INTRAVENOUS

## 2019-09-17 MED ORDER — GABAPENTIN 300 MG PO CAPS
600.0000 mg | ORAL_CAPSULE | Freq: Two times a day (BID) | ORAL | Status: DC
  Administered 2019-09-17: 600 mg via ORAL
  Administered 2019-09-18: 300 mg via ORAL
  Filled 2019-09-17 (×4): qty 2

## 2019-09-17 MED ORDER — FENTANYL CITRATE (PF) 250 MCG/5ML IJ SOLN
INTRAMUSCULAR | Status: DC | PRN
  Administered 2019-09-17 (×4): 50 ug via INTRAVENOUS

## 2019-09-17 MED ORDER — ROCURONIUM BROMIDE 100 MG/10ML IV SOLN
INTRAVENOUS | Status: DC | PRN
  Administered 2019-09-17: 10 mg via INTRAVENOUS
  Administered 2019-09-17 (×2): 30 mg via INTRAVENOUS

## 2019-09-17 MED ORDER — SODIUM CHLORIDE 0.9 % IR SOLN
Status: DC | PRN
  Administered 2019-09-17: 1000 mL

## 2019-09-17 MED ORDER — PHENYLEPHRINE HCL (PRESSORS) 10 MG/ML IV SOLN
INTRAVENOUS | Status: DC | PRN
  Administered 2019-09-17: 50 ug via INTRAVENOUS
  Administered 2019-09-17 (×2): 100 ug via INTRAVENOUS
  Administered 2019-09-17: 50 ug via INTRAVENOUS

## 2019-09-17 MED ORDER — PHENYLEPHRINE HCL (PRESSORS) 10 MG/ML IV SOLN
INTRAVENOUS | Status: AC
  Filled 2019-09-17: qty 1

## 2019-09-17 MED ORDER — SUCCINYLCHOLINE CHLORIDE 20 MG/ML IJ SOLN
INTRAMUSCULAR | Status: DC | PRN
  Administered 2019-09-17: 140 mg via INTRAVENOUS

## 2019-09-17 MED ORDER — EPHEDRINE 5 MG/ML INJ
INTRAVENOUS | Status: AC
  Filled 2019-09-17: qty 10

## 2019-09-17 MED ORDER — ONDANSETRON HCL 4 MG/2ML IJ SOLN
INTRAMUSCULAR | Status: AC
  Filled 2019-09-17: qty 2

## 2019-09-17 MED ORDER — EPHEDRINE SULFATE 50 MG/ML IJ SOLN
INTRAMUSCULAR | Status: DC | PRN
  Administered 2019-09-17 (×2): 5 mg via INTRAVENOUS
  Administered 2019-09-17: 15 mg via INTRAVENOUS

## 2019-09-17 MED ORDER — ACETAMINOPHEN 10 MG/ML IV SOLN
INTRAVENOUS | Status: AC
  Filled 2019-09-17: qty 100

## 2019-09-17 MED ORDER — MAGNESIUM CITRATE PO SOLN
1.0000 | Freq: Once | ORAL | Status: DC | PRN
  Filled 2019-09-17: qty 296

## 2019-09-17 MED ORDER — PROPOFOL 500 MG/50ML IV EMUL
INTRAVENOUS | Status: AC
  Filled 2019-09-17: qty 50

## 2019-09-17 MED ORDER — ONDANSETRON HCL 4 MG/2ML IJ SOLN
INTRAMUSCULAR | Status: AC
  Administered 2019-09-17: 4 mg
  Filled 2019-09-17: qty 2

## 2019-09-17 MED ORDER — PANTOPRAZOLE SODIUM 40 MG PO TBEC
40.0000 mg | DELAYED_RELEASE_TABLET | Freq: Every day | ORAL | Status: DC
  Administered 2019-09-18 – 2019-09-19 (×2): 40 mg via ORAL
  Filled 2019-09-17 (×2): qty 1

## 2019-09-17 MED ORDER — MIDAZOLAM HCL 2 MG/2ML IJ SOLN
INTRAMUSCULAR | Status: AC
  Filled 2019-09-17: qty 2

## 2019-09-17 MED ORDER — POTASSIUM CHLORIDE CRYS ER 20 MEQ PO TBCR
20.0000 meq | EXTENDED_RELEASE_TABLET | Freq: Every day | ORAL | Status: DC
  Administered 2019-09-18 – 2019-09-19 (×2): 20 meq via ORAL
  Filled 2019-09-17 (×2): qty 1

## 2019-09-17 MED ORDER — CLINDAMYCIN PHOSPHATE 900 MG/50ML IV SOLN
INTRAVENOUS | Status: AC
  Filled 2019-09-17: qty 50

## 2019-09-17 MED ORDER — ONDANSETRON HCL 4 MG PO TABS: 4.0000 mg | ORAL_TABLET | Freq: Four times a day (QID) | ORAL | Status: DC | PRN

## 2019-09-17 MED ORDER — BACITRACIN 50000 UNITS IM SOLR
INTRAMUSCULAR | Status: AC
  Filled 2019-09-17: qty 1

## 2019-09-17 MED ORDER — PROPOFOL 10 MG/ML IV BOLUS
INTRAVENOUS | Status: AC
  Filled 2019-09-17: qty 20

## 2019-09-17 MED ORDER — ACETAMINOPHEN 325 MG PO TABS
325.0000 mg | ORAL_TABLET | Freq: Four times a day (QID) | ORAL | Status: DC | PRN
  Administered 2019-09-18 – 2019-09-19 (×3): 325 mg via ORAL
  Filled 2019-09-17 (×4): qty 1

## 2019-09-17 MED ORDER — CHLORHEXIDINE GLUCONATE 0.12 % MT SOLN
OROMUCOSAL | Status: AC
  Administered 2019-09-17: 15 mL via OROMUCOSAL
  Filled 2019-09-17: qty 15

## 2019-09-17 MED ORDER — ALUM & MAG HYDROXIDE-SIMETH 200-200-20 MG/5ML PO SUSP: 30.0000 mL | ORAL | Status: DC | PRN

## 2019-09-17 MED ORDER — DICYCLOMINE HCL 10 MG PO CAPS
10.0000 mg | ORAL_CAPSULE | Freq: Three times a day (TID) | ORAL | Status: DC | PRN
  Filled 2019-09-17: qty 1

## 2019-09-17 MED ORDER — ACETAMINOPHEN 10 MG/ML IV SOLN
INTRAVENOUS | Status: DC | PRN
  Administered 2019-09-17: 1000 mg via INTRAVENOUS

## 2019-09-17 MED ORDER — ONDANSETRON HCL 4 MG/2ML IJ SOLN: 4.0000 mg | Freq: Once | INTRAMUSCULAR | Status: DC | PRN

## 2019-09-17 MED ORDER — BUPIVACAINE-EPINEPHRINE (PF) 0.25% -1:200000 IJ SOLN
INTRAMUSCULAR | Status: AC
  Filled 2019-09-17: qty 30

## 2019-09-17 MED ORDER — FERROUS SULFATE 325 (65 FE) MG PO TABS
325.0000 mg | ORAL_TABLET | Freq: Every day | ORAL | Status: DC
  Administered 2019-09-18 – 2019-09-19 (×2): 325 mg via ORAL
  Filled 2019-09-17 (×2): qty 1

## 2019-09-17 MED ORDER — KETOROLAC TROMETHAMINE 15 MG/ML IJ SOLN
15.0000 mg | Freq: Four times a day (QID) | INTRAMUSCULAR | Status: AC
  Administered 2019-09-17 – 2019-09-18 (×4): 15 mg via INTRAVENOUS
  Filled 2019-09-17 (×4): qty 1

## 2019-09-17 MED ORDER — FLUOXETINE HCL 20 MG PO CAPS
20.0000 mg | ORAL_CAPSULE | Freq: Every day | ORAL | Status: DC
  Administered 2019-09-18 (×2): 20 mg via ORAL
  Filled 2019-09-17 (×3): qty 1

## 2019-09-17 MED ORDER — ROCURONIUM BROMIDE 10 MG/ML (PF) SYRINGE
PREFILLED_SYRINGE | INTRAVENOUS | Status: AC
  Filled 2019-09-17: qty 10

## 2019-09-17 MED ORDER — RIVAROXABAN 10 MG PO TABS
10.0000 mg | ORAL_TABLET | Freq: Every day | ORAL | Status: DC
  Administered 2019-09-18 – 2019-09-19 (×2): 10 mg via ORAL
  Filled 2019-09-17 (×3): qty 1

## 2019-09-17 MED ORDER — HYDROCHLOROTHIAZIDE 25 MG PO TABS
12.5000 mg | ORAL_TABLET | ORAL | Status: DC
  Administered 2019-09-18 – 2019-09-19 (×2): 12.5 mg via ORAL
  Filled 2019-09-17 (×4): qty 1

## 2019-09-17 MED ORDER — PHENOL 1.4 % MT LIQD
1.0000 | OROMUCOSAL | Status: DC | PRN
  Filled 2019-09-17: qty 177

## 2019-09-17 MED ORDER — TRANEXAMIC ACID-NACL 1000-0.7 MG/100ML-% IV SOLN
1000.0000 mg | INTRAVENOUS | Status: AC
  Administered 2019-09-17: 1000 mg via INTRAVENOUS

## 2019-09-17 MED ORDER — ACETAMINOPHEN 500 MG PO TABS
500.0000 mg | ORAL_TABLET | Freq: Four times a day (QID) | ORAL | Status: AC
  Administered 2019-09-17 – 2019-09-18 (×3): 500 mg via ORAL
  Filled 2019-09-17 (×5): qty 1

## 2019-09-17 MED ORDER — MIDAZOLAM HCL 2 MG/2ML IJ SOLN
INTRAMUSCULAR | Status: DC | PRN
  Administered 2019-09-17: 2 mg via INTRAVENOUS

## 2019-09-17 MED ORDER — KETAMINE HCL 50 MG/ML IJ SOLN
INTRAMUSCULAR | Status: AC
  Filled 2019-09-17: qty 10

## 2019-09-17 MED ORDER — HYDROCODONE-ACETAMINOPHEN 5-325 MG PO TABS: 1.0000 | ORAL_TABLET | ORAL | Status: DC | PRN

## 2019-09-17 MED ORDER — POVIDONE-IODINE 10 % EX SWAB: 2.0000 "application " | Freq: Once | CUTANEOUS | Status: DC

## 2019-09-17 MED ORDER — MAGNESIUM HYDROXIDE 400 MG/5ML PO SUSP: 30.0000 mL | Freq: Every day | ORAL | Status: DC | PRN

## 2019-09-17 MED ORDER — FENTANYL CITRATE (PF) 100 MCG/2ML IJ SOLN
25.0000 ug | INTRAMUSCULAR | Status: DC | PRN
  Administered 2019-09-17: 25 ug via INTRAVENOUS

## 2019-09-17 MED ORDER — ONDANSETRON HCL 4 MG/2ML IJ SOLN: 4.0000 mg | Freq: Four times a day (QID) | INTRAMUSCULAR | Status: DC | PRN

## 2019-09-17 MED ORDER — HYDROCODONE-ACETAMINOPHEN 7.5-325 MG PO TABS
1.0000 | ORAL_TABLET | ORAL | Status: DC | PRN
  Administered 2019-09-17: 1 via ORAL
  Administered 2019-09-17: 2 via ORAL
  Administered 2019-09-17 – 2019-09-19 (×7): 1 via ORAL
  Filled 2019-09-17 (×4): qty 1
  Filled 2019-09-17: qty 2
  Filled 2019-09-17 (×5): qty 1

## 2019-09-17 MED ORDER — CHLORHEXIDINE GLUCONATE 0.12 % MT SOLN: 15.0000 mL | Freq: Once | OROMUCOSAL | Status: AC

## 2019-09-17 MED ORDER — TRANEXAMIC ACID-NACL 1000-0.7 MG/100ML-% IV SOLN
INTRAVENOUS | Status: AC
  Filled 2019-09-17: qty 100

## 2019-09-17 MED ORDER — DOCUSATE SODIUM 100 MG PO CAPS
100.0000 mg | ORAL_CAPSULE | Freq: Two times a day (BID) | ORAL | Status: DC
  Administered 2019-09-17 – 2019-09-19 (×4): 100 mg via ORAL
  Filled 2019-09-17 (×4): qty 1

## 2019-09-17 MED ORDER — DEXAMETHASONE SODIUM PHOSPHATE 10 MG/ML IJ SOLN
INTRAMUSCULAR | Status: DC | PRN
  Administered 2019-09-17: 8 mg via INTRAVENOUS

## 2019-09-17 SURGICAL SUPPLY — 48 items
BLADE SAGITTAL WIDE XTHICK NO (BLADE) ×3 IMPLANT
BRUSH SCRUB EZ  4% CHG (MISCELLANEOUS) ×4
BRUSH SCRUB EZ 4% CHG (MISCELLANEOUS) ×2 IMPLANT
CHLORAPREP W/TINT 26 (MISCELLANEOUS) ×3 IMPLANT
COVER HOLE (Hips) ×3 IMPLANT
COVER WAND RF STERILE (DRAPES) ×3 IMPLANT
CUP R3 52MM (Hips) ×3 IMPLANT
DRAPE 3/4 80X56 (DRAPES) ×3 IMPLANT
DRAPE C-ARM 42X72 X-RAY (DRAPES) ×3 IMPLANT
DRAPE STERI IOBAN 125X83 (DRAPES) IMPLANT
DRSG AQUACEL AG ADV 3.5X10 (GAUZE/BANDAGES/DRESSINGS) IMPLANT
DRSG AQUACEL AG ADV 3.5X14 (GAUZE/BANDAGES/DRESSINGS) IMPLANT
ELECT BLADE 6.5 EXT (BLADE) ×3 IMPLANT
ELECT REM PT RETURN 9FT ADLT (ELECTROSURGICAL) ×3
ELECTRODE REM PT RTRN 9FT ADLT (ELECTROSURGICAL) ×1 IMPLANT
GAUZE XEROFORM 1X8 LF (GAUZE/BANDAGES/DRESSINGS) IMPLANT
GLOVE INDICATOR 8.0 STRL GRN (GLOVE) ×3 IMPLANT
GLOVE SURG ORTHO 8.0 STRL STRW (GLOVE) ×6 IMPLANT
GOWN STRL REUS W/ TWL LRG LVL3 (GOWN DISPOSABLE) ×1 IMPLANT
GOWN STRL REUS W/ TWL XL LVL3 (GOWN DISPOSABLE) ×1 IMPLANT
GOWN STRL REUS W/TWL LRG LVL3 (GOWN DISPOSABLE) ×2
GOWN STRL REUS W/TWL XL LVL3 (GOWN DISPOSABLE) ×2
HEAD FEM 36MM 12/14 HIP (Head) ×3 IMPLANT
HOOD PEEL AWAY FLYTE STAYCOOL (MISCELLANEOUS) ×9 IMPLANT
IV NS 1000ML (IV SOLUTION) ×2
IV NS 1000ML BAXH (IV SOLUTION) ×1 IMPLANT
KIT PATIENT CARE HANA TABLE (KITS) ×3 IMPLANT
KIT TURNOVER CYSTO (KITS) ×3 IMPLANT
LINER ACETAB 0 DEG (Liner) ×3 IMPLANT
MAT ABSORB  FLUID 56X50 GRAY (MISCELLANEOUS) ×2
MAT ABSORB FLUID 56X50 GRAY (MISCELLANEOUS) ×1 IMPLANT
NDL SAFETY ECLIPSE 18X1.5 (NEEDLE) ×2 IMPLANT
NEEDLE HYPO 18GX1.5 SHARP (NEEDLE) ×4
NEEDLE HYPO 22GX1.5 SAFETY (NEEDLE) ×3 IMPLANT
NEEDLE SPNL 20GX3.5 QUINCKE YW (NEEDLE) ×3 IMPLANT
PACK HIP PROSTHESIS (MISCELLANEOUS) ×3 IMPLANT
PADDING CAST BLEND 4X4 NS (MISCELLANEOUS) ×6 IMPLANT
PILLOW ABDUCTION MEDIUM (MISCELLANEOUS) ×3 IMPLANT
PULSAVAC PLUS IRRIG FAN TIP (DISPOSABLE) ×3
SCREW 6.5X25MM (Screw) ×3 IMPLANT
STAPLER SKIN PROX 35W (STAPLE) ×3 IMPLANT
STEM LATERAL COLLAR POLARSTEM (Stem) ×3 IMPLANT
SUT BONE WAX W31G (SUTURE) ×3 IMPLANT
SUT DVC 2 QUILL PDO  T11 36X36 (SUTURE) ×2
SUT DVC 2 QUILL PDO T11 36X36 (SUTURE) ×1 IMPLANT
SUT VIC AB 2-0 CT1 18 (SUTURE) ×3 IMPLANT
SYR 20ML LL LF (SYRINGE) ×3 IMPLANT
TIP FAN IRRIG PULSAVAC PLUS (DISPOSABLE) ×1 IMPLANT

## 2019-09-17 NOTE — Progress Notes (Signed)
Ch attempted visit with Pt. PT staff in to do therapy for Pt, [place] will try visit again tomorrow.

## 2019-09-17 NOTE — Anesthesia Procedure Notes (Signed)
Procedure Name: Intubation Date/Time: 09/17/2019 8:38 AM Performed by: Eben Burow, CRNA Pre-anesthesia Checklist: Patient identified, Emergency Drugs available, Suction available and Patient being monitored Patient Re-evaluated:Patient Re-evaluated prior to induction Oxygen Delivery Method: Circle system utilized Preoxygenation: Pre-oxygenation with 100% oxygen Induction Type: IV induction and Cricoid Pressure applied Ventilation: Mask ventilation without difficulty Laryngoscope Size: McGraph and 3 Grade View: Grade I Tube type: Oral Tube size: 7.5 mm Number of attempts: 1 Airway Equipment and Method: Stylet and Video-laryngoscopy Placement Confirmation: ETT inserted through vocal cords under direct vision,  positive ETCO2 and breath sounds checked- equal and bilateral Secured at: 20 cm Tube secured with: Tape Dental Injury: Teeth and Oropharynx as per pre-operative assessment

## 2019-09-17 NOTE — Op Note (Signed)
09/17/2019  10:28 AM  PATIENT:  Victoria Crane   MRN: [id number]  PRE-OPERATIVE DIAGNOSIS:  Osteoarthritis right hip   POST-OPERATIVE DIAGNOSIS: Same  Procedure: Right Total Hip Replacement  Surgeon: Elyn Aquas. Harlow Mares, MD   Assist: Carlynn Spry, PA-C  Anesthesia: Spinal   EBL: 350 mL   Specimens: None   Drains: None   Components used: A size 2 LAT Polarstem Smith [employer] Nephew, R3 size 52 mm shell, and a 36 mm +8 mm head    Description of the procedure in detail: After informed consent was obtained and the appropriate extremity marked in the pre-operative holding area, the patient was taken to the operating room and placed in the supine position on the fracture table. All pressure points were well padded and bilateral lower extremities were place in traction spars. The hip was prepped and draped in standard sterile fashion. A spinal anesthetic had been delivered by the anesthesia team. The skin and subcutaneous tissues were injected with a mixture of Marcaine with epinephrine for post-operative pain. A longitudinal incision approximately 10 cm in length was carried out from the anterior superior iliac spine to the greater trochanter. The tensor fascia was divided and blunt dissection was taken down to the level of the joint capsule. The lateral circumflex vessels were cauterized. Deep retractors were placed and a portion of the anterior capsule was excised. Using fluoroscopy the neck cut was planned and carried out with a sagittal saw. The head was passed from the field with use of a corkscrew and hip skid. Deep retractors were placed along the acetabulum and the degenerative labrum and large osteophytes were removed with a Rongeur. The cup was sequentially reamed to a size 52 mm. The wound was irrigated and using fluoroscopy the size 52 mm cup was impacted in to anatomic position. A single screw was placed followed by a threaded hole cover. The final liner was impacted in to position.  Attention was then turned to the proximal femur. The leg was placed in extension and external rotation. The canal was opened and sequentially broached to a size 2 lateral. The trial components were placed and the hip relocated. The components were found to be in good position using fluoroscopy. The hip was dislocated and the trial components removed. The final components were impacted in to position and the hip relocated. The final components were again check with fluoroscopy and found to be in good position. Hemostasis was achieved with electrocautery. The deep capsule was injected with Marcaine and epinephrine. The wound was irrigated with bacitracin laced normal saline and the tensor fascia closed with #2 Quill suture. The subcutaneous tissues were closed with 2-0 vicryl and staples for the skin. A sterile dressing was applied and an abduction pillow. Patient tolerated the procedure well and there were no apparent complication. Patient was taken to the recovery room in good condition.   Kurtis Bushman, MD

## 2019-09-17 NOTE — Anesthesia Preprocedure Evaluation (Addendum)
Anesthesia Evaluation  Patient identified by MRN, date of birth, ID band Patient awake    Reviewed: Allergy & Precautions, NPO status , Patient's Chart, lab work & pertinent test results  History of Anesthesia Complications (+) PONV and history of anesthetic complications  Airway Mallampati: III       Dental   Pulmonary former smoker,           Cardiovascular hypertension, Pt. on medications      Neuro/Psych negative neurological ROS  negative psych ROS   GI/Hepatic Neg liver ROS, GERD  ,  Endo/Other  negative endocrine ROS  Renal/GU negative Renal ROS  negative genitourinary   Musculoskeletal  (+) Arthritis ,   Abdominal   Peds negative pediatric ROS (+)  Hematology  (+) anemia ,   Anesthesia Other Findings Past Medical History: No date: Anemia No date: Arthritis No date: Cancer ([place])     Comment:  BREAST RIGHT-COLON CA No date: Complication of anesthesia No date: GERD (gastroesophageal reflux disease) No date: Hypertension No date: PONV (postoperative nausea and vomiting)     Comment:  NAUSEA A LONG TIME AGO No date: Thyroid goiter  Reproductive/Obstetrics                             Anesthesia Physical Anesthesia Plan  ASA: II  Anesthesia Plan: General   Post-op Pain Management:    Induction: Intravenous  PONV Risk Score and Plan:   Airway Management Planned: Oral ETT  Additional Equipment:   Intra-op Plan:   Post-operative Plan: Extubation in OR  Informed Consent: I have reviewed the patients History and Physical, chart, labs and discussed the procedure including the risks, benefits and alternatives for the proposed anesthesia with the patient or authorized representative who has indicated his/her understanding and acceptance.     Dental advisory given  Plan Discussed with: CRNA and Surgeon  Anesthesia Plan Comments: (Patient has refused spinal secondary to  multiple back surgeries with instrumentation and loose4 hardware.)      Anesthesia Quick Evaluation

## 2019-09-17 NOTE — H&P (Signed)
The patient has been re-examined, and the chart reviewed, and there have been no interval changes to the documented history and physical.  Plan a right total hip today.  Anesthesia is not consulted regarding a peripheral nerve block for post-operative pain.  The risks, benefits, and alternatives have been discussed at length, and the patient is willing to proceed.    

## 2019-09-17 NOTE — TOC Initial Note (Signed)
Transition of Care [facility]) - Initial/Assessment Note    Patient Details  Name: Victoria Crane MRN: [id number] Date of Birth: 09-Sep-1950  Transition of [facility]) CM/SW Contact:    Elease Hashimoto, LCSW Phone Number: 09/17/2019, 1:38 PM  Clinical Narrative:   Pt lives alone in an apartment and was completely independent and drove herself. She plans to go to her son's home in [facility] when she is discharged from here since there is always someone there and can assist her if needed. She has all needed equipment from previous surgeries and was setting up HHPT down there. Have contacted [facility] since they cover that area to see if can provide follow up. Awaiting return call. Pt feels comfortable with her surgery and hopeful this is the last one she will need to have. Will work on [place] prior to her leaving to go to son's in [facility].                Expected Discharge Plan: [place] Barriers to Discharge: No Barriers Identified   Patient Goals and CMS Choice Patient states their goals for this hospitalization and ongoing recovery are:: I plan to go to my son's home in [facility] tghen eventually back home, when healed CMS Medicare.gov Compare Post Acute Care list provided to:: Patient Choice offered to / list presented to : Patient  Expected Discharge Plan and Services Expected Discharge Plan: [place] In-house Referral: Clinical Social Work   Post Acute Care Choice: [place] arrangements for the past 2 months: Apartment                           HH Arranged: PT [place]: [facility]) Date [place]: 09/17/19 Time [place]: 76 Representative spoke with at [place]: teresa  Prior Living Arrangements/Services Living arrangements for the past 2 months: [place] with:: Self Patient language and need for interpreter reviewed:: No Do you feel safe going back to the place  where you live?: Yes      Need for Family Participation in Patient Care: No (Comment) Care giver support system in place?: Yes (comment) Current home services: DME (has rw and bsc from previous surgeries) Criminal Activity/Legal Involvement Pertinent to Current Situation/Hospitalization: No - Comment as needed  Activities of Daily Living      Permission Sought/Granted Permission sought to share information with : Family Supports Permission granted to share information with : Yes, Verbal Permission Granted  Share Information with NAME: Donovan Kail     Permission granted to share info w Relationship: son     Emotional Assessment Appearance:: Appears stated age Attitude/Demeanor/Rapport: Engaged Affect (typically observed): Adaptable, Accepting Orientation: : Oriented to Self, Oriented to Place, Oriented to  Time, Oriented to Situation Alcohol / Substance Use: Never Used Psych Involvement: No (comment)  Admission diagnosis:  History of total hip replacement, right [G40.102] Patient Active Problem List   Diagnosis Date Noted  . History of total hip replacement, right 09/17/2019   PCP:  Duaine Dredge, MD Pharmacy:   [id number] - [place], [place] - [address] [place] [address] Phone: [phone] Fax: [phone]     Social Determinants of Health (SDOH) Interventions    Readmission Risk Interventions No flowsheet data found.

## 2019-09-17 NOTE — Plan of Care (Signed)
  Problem: Health Behavior/Discharge Planning: Goal: Ability to manage health-related needs will improve Outcome: Progressing   Problem: Clinical Measurements: Goal: Ability to maintain clinical measurements within normal limits will improve Outcome: Progressing Goal: Will remain free from infection Outcome: Progressing   Problem: Activity: Goal: Risk for activity intolerance will decrease Outcome: Progressing   Problem: Nutrition: Goal: Adequate nutrition will be maintained Outcome: Progressing   Problem: Coping: Goal: Level of anxiety will decrease Outcome: Progressing   Problem: Elimination: Goal: Will not experience complications related to bowel motility Outcome: Progressing Goal: Will not experience complications related to urinary retention Outcome: Progressing   Problem: Pain Managment: Goal: General experience of comfort will improve Outcome: Progressing   Problem: Safety: Goal: Ability to remain free from injury will improve Outcome: Progressing   Problem: Skin Integrity: Goal: Risk for impaired skin integrity will decrease Outcome: Progressing   

## 2019-09-17 NOTE — Evaluation (Signed)
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 PMH includes HTN, GERD, arthritis, anemia, PONV, and cancer. Per pt, PMH also includes multiple spinal surgeries, multiple knee surgeries, as well as L THR.  Clinical Impression  Pt was pleasant and motivated to participate during the session. At baseline, pt SpO2 99% on 2L via [place]; nursing notified and agreed to trial pt on room air. Pt demonstrated good ability to perform supine/sitting exercises w/ min instructional cueing; mod therapist assist required to perform SLR and hip abduction. Pt required min-A for RLE management w/ supine-sit. Min A for sit-to-stand primarily for movement initiation and mod cueing required for sequencing and BUE placement. In standing, pt demonstrated a notable forward flexed posture as well as a L lateral lean; pt stated that this is just how she stands. Pt able to march x10 in standing w/ good ability to shift weight through BLE. Pt fatigued at this point; able to side shuffle to [facility] and return to sit. Poor eccentric control noted with return to sit; pt given education on importance of a slow, controlled, movement and how to use BUE to assist. Pt able to stand and walk 64ft to [facility] and 64ft back to bed with CGA and BUE on RW. Pt ambulated with an antalgic, step-to, pattern with good ability to shift weight onto BLE. Pt returned to sit with improved eccentric control; mod-I with sit-to-supine with good use of BUE to assist RLE back to bed. HR and SpO2 remained WFL throughout session; per nursing, pt left on room air. Pt will benefit from HHPT services upon discharge to safely address deficits listed in patient problem list for decreased caregiver assistance and eventual return to PLOF.      Follow Up Recommendations Home health PT    Equipment  Recommendations  None recommended by PT    Recommendations for Other Services       Precautions / Restrictions Precautions Precautions: Fall;Anterior Hip Precaution Booklet Issued: Yes (comment) Restrictions Weight Bearing Restrictions: Yes RLE Weight Bearing: Weight bearing as tolerated      Mobility  Bed Mobility Overal bed mobility: Needs Assistance Bed Mobility: Supine to Sit;Sit to Supine     Supine to sit: Min assist Sit to supine: Modified independent (Device/Increase time)   General bed mobility comments: Min-A supine-sit for RLE management. Mod-I sit-supine with increased time and effort; pt demonstrated good use of BUE to help manage RLE  Transfers Overall transfer level: Needs assistance Equipment used: Rolling walker (2 wheeled) Transfers: Sit to/from Stand Sit to Stand: Min assist         General transfer comment: Pt had significant difficulty with first sit-to-stand attempt. Significant improvement noted with subsequent sit-to-stands with pt requiring min-A w/ min cueing for sequencing and optimal use of BUE  Ambulation/Gait Ambulation/Gait assistance: Min guard Gait Distance (Feet): 4 Feetx2; 2 Feet Assistive device: Rolling walker (2 wheeled) Gait Pattern/deviations: Step-to pattern;Decreased step length - left;Decreased stance time - right;Antalgic;Trunk flexed Gait velocity: decreased   General Gait Details: Pt demonstrated fair ability to lift LLE and shift weight onto RLE in static standing. Pt side shuffled ~41ft R to [facility] and then took a short rest. Pt then ambulated ~96ft to [facility] and 17ft back to bed w/ antalgic gait and fairly significant trunk flexion and L lean (pt states this  is her baseline). CGA provided throughout ambulation  Stairs            Wheelchair Mobility    Modified Rankin (Stroke Patients Only)       Balance Overall balance assessment: Needs assistance Sitting-balance support: Bilateral upper extremity supported;Feet  unsupported;Feet supported Sitting balance-Leahy Scale: Good     Standing balance support: Bilateral upper extremity supported;During functional activity Standing balance-Leahy Scale: Good Standing balance comment: Pt demonstrated good ability to shift weight through BLE w/ light BUE support on RW; no LOB or buckling noted although pt reports a history of leg buckling                             Pertinent Vitals/Pain Pain Assessment: 0-10 Pain Score: 2  Pain Location: R hip Pain Descriptors / Indicators: Aching;Sore Pain Intervention(s): Limited activity within patient's tolerance;Monitored during session;Premedicated before session;Repositioned;Ice applied    Home Living Family/patient expects to be discharged to:: Private residence Living Arrangements: Alone Available Help at Discharge: Family;Neighbor;Available 24 hours/day Type of Home: House Home Access: Stairs to enter Entrance Stairs-Rails: Psychiatric nurse of Steps: 3 stairs with bilateral rails (can't reach both), 1 stair with no rails Home Layout: One level Home Equipment: Walker - 2 wheels;Walker - 4 wheels;Cane - quad;Cane - single point;Bedside commode;Shower seat - built in;Other (comment) (tub transfer bench) Additional Comments: Pt planning to return to her local home for a few days before moving in with son in [facility] for ~58mo. Overall similar home set-ups. Pt's home includes 3 steps to enter w/ bilat rails, tub shower with transfer bench, [place] toilet. Son's home includes: 1 step to enter w/ no rail, walk-in shower w/ seat, low toilets (pt owns / is bringing [facility])    Prior Function Level of Independence: Independent with assistive device(s)         Comments: Pt states a history of unsteady gait due to a cervical issue. Uses rollator for community ambulation and occasional household; primarily uses 2WW for household ambulation. Pt states unable to walk far (i.e. freq uses buggy in stores) 2/2  fatigue and R hip pain. Pt states she has fallen twice in the past 2 weeks; once due to bucking, but pt noted feeling fatigued prior to both falls.     Hand Dominance        Extremity/Trunk Assessment   Upper Extremity Assessment Upper Extremity Assessment: Overall WFL for tasks assessed    Lower Extremity Assessment Lower Extremity Assessment: RLE deficits/detail RLE Deficits / Details: BLE ankle dorsiflexion strength/ROM >3/5 RLE Sensation: WNL       Communication   Communication: No difficulties  Cognition Arousal/Alertness: Awake/alert Behavior During Therapy: WFL for tasks assessed/performed Overall Cognitive Status: Within Functional Limits for tasks assessed                                 General Comments: A&Ox4; retired Loss adjuster, chartered Comments      Exercises Total Joint Exercises Ankle Circles/Pumps: AROM;Strengthening;Both;10 reps Quad Sets: AROM;Strengthening;Both;10 reps Gluteal Sets: AROM;Strengthening;Both;10 reps Hip ABduction/ADduction: AAROM;Strengthening;Right;10 reps Straight Leg Raises: AAROM;Strengthening;Right;10 reps Long Arc Quad: AROM;Strengthening;Right;10 Theatre manager in Standing: AROM;Strengthening;Both;10 reps;Standing Other Exercises Other Exercises: education on HEP exercises and frequency   Assessment/Plan    PT Assessment Patient needs continued PT services  PT Problem List Decreased strength;Decreased range of motion;Decreased activity tolerance;Decreased balance;Decreased mobility;Decreased knowledge  of use of DME;Pain       PT Treatment Interventions DME instruction;Stair training;Functional mobility training;Therapeutic activities;Therapeutic exercise;Balance training;Gait training;Patient/family education    PT Goals (Current goals can be found in the Care Plan section)  Acute Rehab PT Goals Patient Stated Goal: to walk better and to be more steady PT Goal Formulation: With patient Time For Goal  Achievement: 09/30/19 Potential to Achieve Goals: Good    Frequency BID   Barriers to discharge        Co-evaluation               AM-PAC PT "6 Clicks" Mobility  Outcome Measure Help needed turning from your back to your side while in a flat bed without using bedrails?: None Help needed moving from lying on your back to sitting on the side of a flat bed without using bedrails?: A Little Help needed moving to and from a bed to a chair (including a wheelchair)?: A Little Help needed standing up from a chair using your arms (e.g., wheelchair or bedside chair)?: A Little Help needed to walk in hospital room?: A Lot Help needed climbing 3-5 steps with a railing? : A Lot 6 Click Score: 17    End of Session Equipment Utilized During Treatment: Gait belt Activity Tolerance: Patient tolerated treatment well Patient left: in bed;with call bell/phone within reach;with bed alarm set;with SCD's reapplied Nurse Communication: Mobility status;Weight bearing status PT Visit Diagnosis: Unsteadiness on feet (R26.81);Other abnormalities of gait and mobility (R26.89);Muscle weakness (generalized) (M62.81);History of falling (Z91.81);Pain Pain - Right/Left: Right Pain - part of body: Hip    Time: 1959-7471 PT Time Calculation (min) (ACUTE ONLY): 62 min   Charges:              Tamanna Whitson SPT 09/17/19, 5:17 PM

## 2019-09-17 NOTE — Progress Notes (Signed)
Patient bladder scanned after surgery on arrival to pacu,. 44cc present

## 2019-09-17 NOTE — Transfer of Care (Signed)
Immediate Anesthesia Transfer of Care Note  Patient: Victoria Crane  Procedure(s) Performed: TOTAL HIP ARTHROPLASTY ANTERIOR APPROACH (Right Hip)  Patient Location: PACU  Anesthesia Type:General  Level of Consciousness: awake, alert , oriented and patient cooperative  Airway & Oxygen Therapy: Patient Spontanous Breathing and Patient connected to face mask oxygen  Post-op Assessment: Report given to RN and Post -op Vital signs reviewed and stable  Post vital signs: Reviewed and stable  Last Vitals:  Vitals Value Taken Time  BP 173/83 09/17/19 1034  Temp    Pulse 90 09/17/19 1035  Resp 17 09/17/19 1035  SpO2 100 % 09/17/19 1035  Vitals shown include unvalidated device data.  Last Pain:  Vitals:   09/17/19 1033  TempSrc:   PainSc: (P) 0-No pain      Patients Stated Pain Goal: 3 (15/86/82 5749)  Complications: No complications documented.

## 2019-09-17 NOTE — Anesthesia Postprocedure Evaluation (Signed)
Anesthesia Post Note  Patient: Victoria Crane  Procedure(s) Performed: TOTAL HIP ARTHROPLASTY ANTERIOR APPROACH (Right Hip)  Patient location during evaluation: PACU Anesthesia Type: General Level of consciousness: awake and alert Pain management: pain level controlled Vital Signs Assessment: post-procedure vital signs reviewed and stable Respiratory status: spontaneous breathing, nonlabored ventilation, respiratory function stable and patient connected to nasal cannula oxygen Cardiovascular status: blood pressure returned to baseline and stable Postop Assessment: no apparent nausea or vomiting Anesthetic complications: no   No complications documented.   Last Vitals:  Vitals:   09/17/19 1213 09/17/19 1311  BP: 106/64 (!) 146/77  Pulse: 77 80  Resp: 18 18  Temp: 36.6 C 36.6 C  SpO2: 100% 100%    Last Pain:  Vitals:   09/17/19 1311  TempSrc: Oral  PainSc:                  Precious Haws [employer]

## 2019-09-18 ENCOUNTER — Encounter: Payer: Self-pay | Admitting: Orthopedic Surgery

## 2019-09-18 LAB — BPAM RBC
Blood Product Expiration Date: 202108282359
Unit Type and Rh: 7300

## 2019-09-18 LAB — CBC
HCT: 22.5 % — ABNORMAL LOW (ref 36.0–46.0)
HCT: 29.2 % — ABNORMAL LOW (ref 36.0–46.0)
Hemoglobin: 7.1 g/dL — ABNORMAL LOW (ref 12.0–15.0)
Hemoglobin: 9.4 g/dL — ABNORMAL LOW (ref 12.0–15.0)
MCH: 30.2 pg (ref 26.0–34.0)
MCH: 30.3 pg (ref 26.0–34.0)
MCHC: 31.6 g/dL (ref 30.0–36.0)
MCHC: 32.2 g/dL (ref 30.0–36.0)
MCV: 95.7 fL (ref 80.0–100.0)
MCV: 94.2 fL (ref 80.0–100.0)
Platelets: 173 10*3/uL (ref 150–400)
Platelets: 260 10*3/uL (ref 150–400)
RBC: 2.35 MIL/uL — ABNORMAL LOW (ref 3.87–5.11)
RBC: 3.1 MIL/uL — ABNORMAL LOW (ref 3.87–5.11)
RDW: 13.4 % (ref 11.5–15.5)
RDW: 13.3 % (ref 11.5–15.5)
WBC: 9.6 10*3/uL (ref 4.0–10.5)
WBC: 13.4 10*3/uL — ABNORMAL HIGH (ref 4.0–10.5)
nRBC: 0 % (ref 0.0–0.2)
nRBC: 0 % (ref 0.0–0.2)

## 2019-09-18 LAB — BASIC METABOLIC PANEL
Anion gap: 10 (ref 5–15)
BUN: 16 mg/dL (ref 8–23)
CO2: 19 mmol/L — ABNORMAL LOW (ref 22–32)
Calcium: 7.9 mg/dL — ABNORMAL LOW (ref 8.9–10.3)
Chloride: 104 mmol/L (ref 98–111)
Creatinine, Ser: 0.67 mg/dL (ref 0.44–1.00)
GFR calc Af Amer: >60 mL/min (ref >60)
GFR calc non Af Amer: >60 mL/min (ref >60)
Glucose, Bld: 102 mg/dL — ABNORMAL HIGH (ref 70–99)
Potassium: 4.9 mmol/L (ref 3.5–5.1)
Sodium: 133 mmol/L — ABNORMAL LOW (ref 135–145)

## 2019-09-18 LAB — TYPE AND SCREEN
ABO/RH(D): B POS
Antibody Screen: NEGATIVE
Unit division: 0

## 2019-09-18 LAB — SURGICAL PATHOLOGY

## 2019-09-18 LAB — PREPARE RBC (CROSSMATCH)

## 2019-09-18 MED ORDER — SODIUM CHLORIDE 0.9% IV SOLUTION: Freq: Once | INTRAVENOUS | Status: DC

## 2019-09-18 MED ORDER — GABAPENTIN 300 MG PO CAPS: 600.0000 mg | ORAL_CAPSULE | Freq: Every day | ORAL | Status: DC

## 2019-09-18 MED ORDER — GABAPENTIN 300 MG PO CAPS
300.0000 mg | ORAL_CAPSULE | Freq: Every day | ORAL | Status: DC
  Administered 2019-09-19: 300 mg via ORAL
  Filled 2019-09-18: qty 1

## 2019-09-18 NOTE — Progress Notes (Signed)
  Subjective:  Patient reports pain as mild to moderate.    Objective:   VITALS:   Vitals:   09/17/19 1546 09/17/19 2020 09/18/19 0025 09/18/19 0421  BP: 102/63 111/66 (!) 106/57 (!) 108/58  Pulse: 81 79 80 79  Resp: 18 18 16 18   Temp: 98.3 F (36.8 C) 98 F (36.7 C) 98.2 F (36.8 C) 98 F (36.7 C)  TempSrc: Oral Oral Oral Oral  SpO2: 93% 95% 94% 94%  Weight:      Height:        PHYSICAL EXAM:  Neurologically intact ABD soft Neurovascular intact Sensation intact distally Intact pulses distally Dorsiflexion/Plantar flexion intact Incision: scant drainage No cellulitis present Compartment soft  LABS  Results for orders placed or performed during the hospital encounter of 09/17/19 (from the past 24 hour(s))  ABO/Rh     Status: None   Collection Time: 09/17/19  8:09 AM  Result Value Ref Range   ABO/RH(D)      B POS Performed at [facility], [address] [place], [place] [address]   CBC     Status: Abnormal   Collection Time: 09/18/19  4:13 AM  Result Value Ref Range   WBC 9.6 4.0 - 10.5 K/uL   RBC 2.35 (L) 3.87 - 5.11 MIL/uL   Hemoglobin 7.1 (L) 12.0 - 15.0 g/dL   HCT 22.5 (L) 36 - 46 %   MCV 95.7 80.0 - 100.0 fL   MCH 30.2 26.0 - 34.0 pg   MCHC 31.6 30.0 - 36.0 g/dL   RDW 13.4 11.5 - 15.5 %   Platelets 173 150 - 400 K/uL   nRBC 0.0 0.0 - 0.2 %  Basic metabolic panel     Status: Abnormal   Collection Time: 09/18/19  4:13 AM  Result Value Ref Range   Sodium 133 (L) 135 - 145 mmol/L   Potassium 4.9 3.5 - 5.1 mmol/L   Chloride 104 98 - 111 mmol/L   CO2 19 (L) 22 - 32 mmol/L   Glucose, Bld 102 (H) 70 - 99 mg/dL   BUN 16 8 - 23 mg/dL   Creatinine, Ser 0.67 0.44 - 1.00 mg/dL   Calcium 7.9 (L) 8.9 - 10.3 mg/dL   GFR calc non Af Amer >60 >60 mL/min   GFR calc Af Amer >60 >60 mL/min   Anion gap 10 5 - 15    DG HIP OPERATIVE UNILAT W OR W/O PELVIS RIGHT  Result Date: 09/17/2019 CLINICAL DATA:  Elective right hip surgery. EXAM: OPERATIVE right  HIP (WITH PELVIS IF PERFORMED) 3 VIEWS TECHNIQUE: Fluoroscopic spot image(s) were submitted for interpretation post-operatively. FLUOROSCOPY TIME:  12 seconds. COMPARISON:  None. FINDINGS: Three intraoperative fluoroscopic images were obtained of the right hip. The right femoral and acetabular components appear to be well situated. IMPRESSION: Fluoroscopic guidance provided during right total hip arthroplasty. Electronically Signed   By: Marijo Conception M.D.   On: 09/17/2019 10:38    Assessment/Plan: 1 Day Post-Op   Active Problems:   History of total hip replacement, right   Advance diet Up with therapy  Discharge home tomorrow   Carlynn Spry , PA-C 09/18/2019, 6:35 AM

## 2019-09-18 NOTE — Progress Notes (Signed)
Pt sitting in chair. Pt denies any pain at this time. Call bell within reach. Pt denies any further needs

## 2019-09-18 NOTE — Progress Notes (Signed)
Pt has no IV access. RN looked for new site, Pt difficult stick, IV team consult in. Pt signed Blood consent, RN witnessed. Pt denies any questions or concerns about blood transfusion.

## 2019-09-18 NOTE — Progress Notes (Signed)
Physical Therapy Treatment Patient Details Name: Victoria Crane MRN: [id number] DOB: 12/07/1950 Today's Date: 09/18/2019    History of Present Illness Pt is a 69 year old female presenting with OA of the R hip and is now s/p R THR. PMH includes HTN, GERD, arthritis, anemia, PONV, and cancer. Per pt, PMH also includes multiple spinal surgeries, multiple knee surgeries, as well as L THR.    PT Comments    Pt was pleasant and motivated to participate during the session. Pt reported pain 4/10 at beginning of session. Pt able to perform supine/sitting exercises w/ min instructional cueing and min-A from therapist for SLR and hip abduction. Pt bed mobility improved and pt now mod-I w/ supine-to-sit. Pt required CGA for sit-to-stand and ambulation. Pt was able to ambulate 157ft w/ min cueing for sequencing and management of RW; continued to have significant trunk flexion with some left lateral lean which pt was able to improve w/ cueing. Pt will benefit from HHPT services upon discharge to safely address deficits listed in patient problem list for decreased caregiver assistance and eventual return to PLOF.    Follow Up Recommendations  Home health PT     Equipment Recommendations  None recommended by PT    Recommendations for Other Services       Precautions / Restrictions Precautions Precautions: Fall;Anterior Hip Precaution Booklet Issued: Yes (comment) Restrictions Weight Bearing Restrictions: Yes RLE Weight Bearing: Weight bearing as tolerated    Mobility  Bed Mobility   Bed Mobility: Supine to Sit     Supine to sit: Modified independent (Device/Increase time)        Transfers Overall transfer level: Needs assistance Equipment used: Rolling walker (2 wheeled) Transfers: Sit to/from Stand Sit to Stand: Min guard         General transfer comment: min cueing for sequencing  Ambulation/Gait Ambulation/Gait assistance: Min guard Gait Distance (Feet): 125 Feet Assistive  device: Rolling walker (2 wheeled) Gait Pattern/deviations: Step-to pattern;Decreased step length - left;Decreased stance time - right;Antalgic;Trunk flexed Gait velocity: decreased   General Gait Details: Pt req min cueing for sequencing and management of RW. Continued to have trunk flexed and L lateral lean, able to improve with cueing.   Stairs             Wheelchair Mobility    Modified Rankin (Stroke Patients Only)       Balance Overall balance assessment: Needs assistance Sitting-balance support: No upper extremity supported;Feet supported Sitting balance-Leahy Scale: Good     Standing balance support: Bilateral upper extremity supported;During functional activity Standing balance-Leahy Scale: Good Standing balance comment: Pt demonstrated good ability to shift weight through BLE w/ light BUE support on RW; no LOB or buckling noted although pt reports a history of leg buckling                            Cognition Arousal/Alertness: Awake/alert Behavior During Therapy: WFL for tasks assessed/performed Overall Cognitive Status: Within Functional Limits for tasks assessed                                 General Comments: A&Ox4; retired Immunologist Total Joint Exercises Ankle Circles/Pumps: AROM;Strengthening;Both;10 reps Quad Sets: AROM;Strengthening;Both;10 reps Gluteal Sets: AROM;Strengthening;Both;10 reps Hip ABduction/ADduction: AAROM;Strengthening;Right;10 reps Straight Leg Raises: AAROM;Strengthening;Right;10 reps;15 reps Long Arc Quad: AROM;Strengthening;Right;10 reps;15 reps Marching in Standing: AROM;Strengthening;Both;Standing;5 reps  General Comments        Pertinent Vitals/Pain Pain Assessment: 0-10 Pain Score: 4  Pain Location: R hip Pain Descriptors / Indicators: Aching;Sore Pain Intervention(s): Limited activity within patient's tolerance;Monitored during session;Repositioned;RN gave pain meds during  session    Home Living                      Prior Function            PT Goals (current goals can now be found in the care plan section) Progress towards PT goals: Progressing toward goals    Frequency    BID      PT Plan Current plan remains appropriate    Co-evaluation              AM-PAC PT "6 Clicks" Mobility   Outcome Measure  Help needed turning from your back to your side while in a flat bed without using bedrails?: None Help needed moving from lying on your back to sitting on the side of a flat bed without using bedrails?: A Little Help needed moving to and from a bed to a chair (including a wheelchair)?: A Little Help needed standing up from a chair using your arms (e.g., wheelchair or bedside chair)?: A Little Help needed to walk in hospital room?: A Little Help needed climbing 3-5 steps with a railing? : A Lot 6 Click Score: 18    End of Session Equipment Utilized During Treatment: Gait belt Activity Tolerance: Patient tolerated treatment well Patient left: with call bell/phone within reach;with SCD's reapplied;in chair;with chair alarm set;with nursing/sitter in room Nurse Communication: Mobility status;Weight bearing status PT Visit Diagnosis: Unsteadiness on feet (R26.81);Other abnormalities of gait and mobility (R26.89);Muscle weakness (generalized) (M62.81);History of falling (Z91.81);Pain Pain - Right/Left: Right Pain - part of body: Hip     Time: 7342-8768 PT Time Calculation (min) (ACUTE ONLY): 31 min  Charges:                        Ginni Eichler SPT 09/18/19, 4:59 PM

## 2019-09-18 NOTE — Progress Notes (Signed)
Pt only wanting got take 300mg  of gabapentin in am and 600 mg at night (this is her home prescription) and requesting arthritis medication. RN notified Linton Rump PA

## 2019-09-18 NOTE — Progress Notes (Signed)
Pt's HGB 9.4. RN notified Dr. Harlow Mares to see if he would like to proceed with blood transfusion or hold.

## 2019-09-18 NOTE — Evaluation (Signed)
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 PMH includes HTN, GERD, arthritis, anemia, PONV, and cancer. Per pt, PMH also includes multiple spinal surgeries, multiple knee surgeries, as well as L THR.   Clinical Impression   Pt seen for OT evaluation this date, POD#1 from above surgery. Pt was independent in all ADL prior to surgery, using AD for mobility, and increasingly limited with mobility and IADL performance due to R hip pain. Pt reports renovations to make her bathroom handicap accessible in the near term future. Pt is eager to return to PLOF with less pain and improved safety and independence. Pt currently requires CGA for ADL transfers and MIN ASSIST for LB dressing and bathing while in seated position due to limited AROM of R hip. Pt instructed in self care skills, falls prevention strategies, home/routines modifications, and DME/AE for LB bathing and dressing tasks; handout provided. Pt verbalized understanding. Plans to return home briefly with support then live with her son in [facility] for approx 1 month while she recover. Do not anticipate need for additional skilled OT needs at this time. Will sign off.       Follow Up Recommendations  No OT follow up    Equipment Recommendations  Other (comment) (reacher, HH shower head)    Recommendations for Other Services       Precautions / Restrictions Precautions Precautions: Fall;Anterior Hip Precaution Booklet Issued: Yes (comment) Restrictions Weight Bearing Restrictions: Yes RLE Weight Bearing: Weight bearing as tolerated      Mobility Bed Mobility               General bed mobility comments: deferred, in recliner at start and end of session  Transfers Overall transfer level: Needs assistance Equipment used: Rolling walker (2  wheeled) Transfers: Sit to/from Stand Sit to Stand: Min guard              Balance Overall balance assessment: Needs assistance Sitting-balance support: Bilateral upper extremity supported;Feet unsupported;Feet supported Sitting balance-Leahy Scale: Good     Standing balance support: Bilateral upper extremity supported;During functional activity Standing balance-Leahy Scale: Good                             ADL either performed or assessed with clinical judgement   ADL Overall ADL's : Needs assistance/impaired                                       General ADL Comments: Pt requires Min A for LB ADL tasks without AE, mod indep with LB ADL when using AE; CGA for ADL transfers; pt reports family can provide needed level of assist     Vision Baseline Vision/History: Wears glasses Wears Glasses: At all times Patient Visual Report: No change from baseline       Perception     Praxis      Pertinent Vitals/Pain Pain Assessment: No/denies pain     Hand Dominance Right   Extremity/Trunk Assessment Upper Extremity Assessment Upper Extremity Assessment: Overall WFL for tasks assessed   Lower Extremity Assessment Lower Extremity Assessment: RLE deficits/detail RLE Deficits / Details: expected strength/ROM deficits       Communication Communication Communication:  No difficulties   Cognition Arousal/Alertness: Awake/alert Behavior During Therapy: WFL for tasks assessed/performed Overall Cognitive Status: Within Functional Limits for tasks assessed                                     General Comments       Exercises Other Exercises Other Exercises: Pt instructed in falls prevention, home/routines modifications, AE/DME for ADL tasks, and gradual increase in activty at home to maximize pt's return to PLOF following surgery; handout provided   Shoulder Instructions      Home Living Family/patient expects to be discharged  to:: Private residence Living Arrangements: Alone Available Help at Discharge: Family;Neighbor;Available 24 hours/day Type of Home: House Home Access: Stairs to enter [employer] of Steps: 3 stairs with bilateral rails (can't reach both), 1 stair with no rails Entrance Stairs-Rails: Right;Left Home Layout: One level     Bathroom Shower/Tub: Occupational psychologist: Handicapped height Bathroom Accessibility: Yes   Home Equipment: Environmental consultant - 2 wheels;Walker - 4 wheels;Cane - quad;Cane - single point;Bedside commode;Shower seat - built in;Other (comment) (tub transfer bench)   Additional Comments: Pt planning to return to her local home for a few days before moving in with son in [facility] for ~37mo. Overall similar home set-ups. Pt's home includes 3 steps to enter w/ bilat rails, tub shower with transfer bench, [place] toilet. Son's home includes: 1 step to enter w/ no rail, walk-in shower w/ seat, low toilets (pt owns / is bringing [facility])      Prior Functioning/Environment Level of Independence: Independent with assistive device(s)        Comments: Pt states a history of unsteady gait due to a cervical issue. Uses rollator for community ambulation and occasional household; primarily uses 2WW for household ambulation. Pt states unable to walk far (i.e. freq uses buggy in stores) 2/2 fatigue and R hip pain. Pt states she has fallen twice in the past 2 weeks; once due to bucking, but pt noted feeling fatigued prior to both falls.        OT Problem List: Decreased strength;Decreased range of motion      OT Treatment/Interventions:      OT Goals(Current goals can be found in the care plan section) Acute Rehab OT Goals Patient Stated Goal: improve strength and move without pain OT Goal Formulation: All assessment and education complete, DC therapy  OT Frequency:     Barriers to D/C:            Co-evaluation              AM-PAC OT "6 Clicks" Daily Activity     Outcome  Measure Help from another person eating meals?: None Help from another person taking care of personal grooming?: None Help from another person toileting, which includes using toliet, bedpan, or urinal?: A Little Help from another person bathing (including washing, rinsing, drying)?: A Little Help from another person to put on and taking off regular upper body clothing?: None Help from another person to put on and taking off regular lower body clothing?: A Little 6 Click Score: 21   End of Session    Activity Tolerance: Patient tolerated treatment well Patient left: in chair;with call bell/phone within reach;with chair alarm set;with SCD's reapplied  OT Visit Diagnosis: Other abnormalities of gait and mobility (R26.89)  Time: 1712-7871 OT Time Calculation (min): 18 min Charges:  OT General Charges $OT Visit: 1 Visit OT Evaluation $OT Eval Low Complexity: 1 Low OT Treatments $Self Care/Home Management : 8-22 mins  Jeni Salles, MPH, MS, OTR/L ascom [phone] 09/18/19, 10:36 AM

## 2019-09-18 NOTE — Progress Notes (Signed)
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 PMH includes HTN, GERD, arthritis, anemia, PONV, and cancer. Per pt, PMH also includes multiple spinal surgeries, multiple knee surgeries, as well as L THR.    PT Comments    Pt was pleasant and motivated to participate during the session. Pt found in recliner at beginning of session. Pt able to perform supine/sitting exercises w/ min instructional cueing; min-A from therapist required for SLR and hip abd. Pt required CGA for sit-to-stand and demonstrated fair-good eccentric control which was a notable improvement from yesterday's session. Pt able to ambulate 76ft x 2 w/ RW and CGA w/ min cueing for sequencing. Further ambulation deferred 2/2 low (7.1) Hgb this a.m.; pt remained asymptomatic throughout session. Pt continued to demonstrate good ability to shift weight through BLE w/ light BUE support on RW. HR and SpO2 monitored and WFL throughout session. Pt will benefit from HHPT services upon discharge to safely address deficits listed in patient problem list for decreased caregiver assistance and eventual return to PLOF.   Follow Up Recommendations  Home health PT     Equipment Recommendations  None recommended by PT    Recommendations for Other Services       Precautions / Restrictions Precautions Precautions: Fall;Anterior Hip Precaution Booklet Issued: Yes (comment) Restrictions Weight Bearing Restrictions: Yes RLE Weight Bearing: Weight bearing as tolerated    Mobility  Bed Mobility               General bed mobility comments: deferred, in recliner at start and end of session  Transfers Overall transfer level: Needs assistance Equipment used: Rolling walker (2 wheeled) Transfers: Sit to/from Stand Sit to Stand: Min guard          General transfer comment: min cueing for sequencing and BUE placement  Ambulation/Gait Ambulation/Gait assistance: Min guard Gait Distance (Feet): 10 Feet Assistive device: Rolling walker (2 wheeled) Gait Pattern/deviations: Step-to pattern;Decreased step length - left;Decreased stance time - right;Antalgic;Trunk flexed Gait velocity: decreased   General Gait Details: Pt req min cueing for sequencing w/ gait; able to walk 52ft x2 w/ further ambulation deferred 2/2 low Hgb (7.1) and pt pending transfusion. Pt asymptomatic. Continues to have trunk flexed and L lateral lean in standing; normal per pt   Stairs             Wheelchair Mobility    Modified Rankin (Stroke Patients Only)       Balance Overall balance assessment: Needs assistance Sitting-balance support: No upper extremity supported;Feet supported Sitting balance-Leahy Scale: Good     Standing balance support: Bilateral upper extremity supported;During functional activity Standing balance-Leahy Scale: Good Standing balance comment: Pt demonstrated good ability to shift weight through BLE w/ light BUE support on RW; no LOB or buckling noted although pt reports a history of leg buckling                            Cognition Arousal/Alertness: Awake/alert Behavior During Therapy: WFL for tasks assessed/performed Overall Cognitive Status: Within Functional Limits for tasks assessed                                 General Comments: A&Ox4; retired Marine scientist  Exercises Total Joint Exercises Ankle Circles/Pumps: AROM;Strengthening;Both;10 reps Quad Sets: AROM;Strengthening;Both;10 reps Gluteal Sets: AROM;Strengthening;Both;10 reps Hip ABduction/ADduction: AAROM;Strengthening;Right;10 reps Straight Leg Raises: AAROM;Strengthening;Right;10 reps;15 reps Long Arc Quad: AROM;Strengthening;Right;10 reps;15 reps Marching in Standing: AROM;Strengthening;Both;10 reps;Standing Other  Exercises Other Exercises: education on HEP exercises and frequency     General Comments        Pertinent Vitals/Pain Pain Assessment: Faces Faces Pain Scale: Hurts a little bit Pain Location: R hip Pain Descriptors / Indicators: Aching;Sore Pain Intervention(s): Limited activity within patient's tolerance;Monitored during session;Premedicated before session;Repositioned    Home Living Family/patient expects to be discharged to:: Private residence Living Arrangements: Alone Available Help at Discharge: Family;Neighbor;Available 24 hours/day Type of Home: House Home Access: Stairs to enter Entrance Stairs-Rails: Right;Left Home Layout: One level Home Equipment: Environmental consultant - 2 wheels;Walker - 4 wheels;Cane - quad;Cane - single point;Bedside commode;Shower seat - built in;Other (comment) (tub transfer bench) Additional Comments: Pt planning to return to her local home for a few days before moving in with son in [facility] for ~25mo. Overall similar home set-ups. Pt's home includes 3 steps to enter w/ bilat rails, tub shower with transfer bench, [place] toilet. Son's home includes: 1 step to enter w/ no rail, walk-in shower w/ seat, low toilets (pt owns / is bringing [facility])    Prior Function Level of Independence: Independent with assistive device(s)      Comments: Pt states a history of unsteady gait due to a cervical issue. Uses rollator for community ambulation and occasional household; primarily uses 2WW for household ambulation. Pt states unable to walk far (i.e. freq uses buggy in stores) 2/2 fatigue and R hip pain. Pt states she has fallen twice in the past 2 weeks; once due to bucking, but pt noted feeling fatigued prior to both falls.   PT Goals (current goals can now be found in the care plan section) Acute Rehab PT Goals Patient Stated Goal: improve strength and move without pain Progress towards PT goals: Progressing toward goals    Frequency    BID      PT Plan Current plan remains  appropriate    Co-evaluation              AM-PAC PT "6 Clicks" Mobility   Outcome Measure  Help needed turning from your back to your side while in a flat bed without using bedrails?: None Help needed moving from lying on your back to sitting on the side of a flat bed without using bedrails?: A Little Help needed moving to and from a bed to a chair (including a wheelchair)?: A Little Help needed standing up from a chair using your arms (e.g., wheelchair or bedside chair)?: A Little Help needed to walk in hospital room?: A Lot Help needed climbing 3-5 steps with a railing? : A Lot 6 Click Score: 17    End of Session Equipment Utilized During Treatment: Gait belt Activity Tolerance: Patient tolerated treatment well Patient left: with call bell/phone within reach;with SCD's reapplied;in chair;with chair alarm set;with nursing/sitter in room Nurse Communication: Mobility status;Weight bearing status PT Visit Diagnosis: Unsteadiness on feet (R26.81);Other abnormalities of gait and mobility (R26.89);Muscle weakness (generalized) (M62.81);History of falling (Z91.81);Pain Pain - Right/Left: Right Pain - part of body: Hip     Time: 1010-1034 PT Time Calculation (min) (ACUTE ONLY): 24 min  Charges:                        Herbert Moors SPT 09/18/19, 12:52 PM

## 2019-09-18 NOTE — Progress Notes (Signed)
Lab called to verify pt hasn't had blood transfusion yet. Hgb came back at 9.3, Lab to do a recollect.

## 2019-09-18 NOTE — Progress Notes (Signed)
Pt sitting in bed. Pt 3/10 pain, denies need for pain medication. RN waiting on CBC results. Call bell within reach, RN number on pt's board

## 2019-09-18 NOTE — Plan of Care (Signed)
  Problem: Health Behavior/Discharge Planning: Goal: Ability to manage health-related needs will improve Outcome: Progressing   Problem: Clinical Measurements: Goal: Ability to maintain clinical measurements within normal limits will improve Outcome: Progressing Goal: Will remain free from infection Outcome: Progressing   Problem: Activity: Goal: Risk for activity intolerance will decrease Outcome: Progressing   Problem: Nutrition: Goal: Adequate nutrition will be maintained Outcome: Progressing   Problem: Coping: Goal: Level of anxiety will decrease Outcome: Progressing   Problem: Elimination: Goal: Will not experience complications related to bowel motility Outcome: Progressing Goal: Will not experience complications related to urinary retention Outcome: Progressing   Problem: Pain Managment: Goal: General experience of comfort will improve Outcome: Progressing   Problem: Safety: Goal: Ability to remain free from injury will improve Outcome: Progressing   Problem: Skin Integrity: Goal: Risk for impaired skin integrity will decrease Outcome: Progressing   

## 2019-09-18 NOTE — Progress Notes (Signed)
RN notified Dr. Harlow Mares about pt's Hgb 7.1. Pt asymptomatic

## 2019-09-18 NOTE — TOC Progression Note (Signed)
Transition of Care [facility]) - Progression Note    Patient Details  Name: Victoria Crane MRN: [id number] Date of Birth: 08/26/1950  [facility]) CM/SW Contact  Daemyn Gariepy, Gardiner Rhyme, LCSW Phone Number: 09/18/2019, 10:13 AM  Clinical Narrative:  Pt has arranged for her to have an [place] appointment in [facility] next week. Will need to get OP order from MD to give to them when goes to appointment. Pt's son to come and transport to his home once medically stable and can be discharged home.     Expected Discharge Plan: [place] Barriers to Discharge: No Barriers Identified  Expected Discharge Plan and Services Expected Discharge Plan: [place] In-house Referral: Clinical Social Work   Post Acute Care Choice: [place] arrangements for the past 2 months: Apartment                           [place]: PT [place]: [place] (now [facility]) Date [place]: 09/17/19 Time [place]: 65 Representative spoke with at [place]: teresa   Social Determinants of Health ([place]) Interventions    Readmission Risk Interventions No flowsheet data found.

## 2019-09-19 LAB — CBC
HCT: 24.2 % — ABNORMAL LOW (ref 36.0–46.0)
Hemoglobin: 7.7 g/dL — ABNORMAL LOW (ref 12.0–15.0)
MCH: 30 pg (ref 26.0–34.0)
MCHC: 31.8 g/dL (ref 30.0–36.0)
MCV: 94.2 fL (ref 80.0–100.0)
Platelets: 185 10*3/uL (ref 150–400)
RBC: 2.57 MIL/uL — ABNORMAL LOW (ref 3.87–5.11)
RDW: 13.4 % (ref 11.5–15.5)
WBC: 9.2 10*3/uL (ref 4.0–10.5)
nRBC: 0 % (ref 0.0–0.2)

## 2019-09-19 MED ORDER — DOCUSATE SODIUM 100 MG PO CAPS: 100.0000 mg | ORAL_CAPSULE | Freq: Two times a day (BID) | ORAL | 0 refills | Status: DC

## 2019-09-19 MED ORDER — ONDANSETRON HCL 4 MG PO TABS: 4.0000 mg | ORAL_TABLET | Freq: Four times a day (QID) | ORAL | 0 refills | Status: AC | PRN

## 2019-09-19 MED ORDER — HYDROCODONE-ACETAMINOPHEN 5-325 MG PO TABS: 1.0000 | ORAL_TABLET | ORAL | 0 refills | Status: AC | PRN

## 2019-09-19 MED ORDER — ASPIRIN EC 81 MG PO TBEC
81.0000 mg | DELAYED_RELEASE_TABLET | Freq: Two times a day (BID) | ORAL | 11 refills | Status: DC
Start: 2019-09-19 — End: 2020-07-19

## 2019-09-19 NOTE — Progress Notes (Signed)
Physical Therapy Treatment Patient Details Name: Victoria Crane MRN: [id number] DOB: [birth year] Today's Date: 09/19/2019    History of Present Illness Pt is a 69 year old female presenting with OA of the R hip and is now s/p R THR. PMH includes HTN, GERD, arthritis, anemia, PONV, and cancer. Per pt, PMH also includes multiple spinal surgeries, multiple knee surgeries, as well as L THR.    PT Comments    Pt was pleasant and motivated to participate during the session. Pt found in recliner and reported 3/10 pain. Pt able to perform sitting therex w/ min cueing and min-A for SLR. Pt able to sit-to-stand w/ CGA; min cueing required for return to sit for hand placement, as pt occasionally forgets to reach back for chair and has poor eccentric control w/o BUE assist. Pt reported some dizziness/fatigue after standing marches and asked to sit down; HR 90 and SpO2 95%. Pt able to ambulate 62ft w/ RW and CGA; min cueing for sequencing and management of RW. Pt given education on sequencing for 1 stair w/ RW and for multiple stairs w/ use of R rail. Pt able to ascend 1 stair backwards and descend forwards x2 w/ RW; demonstrated good carryover and able to perform w/o cueing. Pt had more difficulty w/ ascending and descending 3x stairs and required mod-max cueing for foot and hand placement as well as CGA. Pt verbalized and demonstrated understanding of sequencing. Pt given education on car transfers to prepare for safe d/c; pt verbalized understanding. HR and SpO2 monitored and remained WFL throughout session. Pt will benefit from HHPT services upon discharge to safely address deficits listed in patient problem list for decreased caregiver assistance and eventual return to PLOF.   Follow Up Recommendations  Home health PT     Equipment Recommendations  None recommended by PT    Recommendations for Other Services       Precautions / Restrictions Precautions Precautions: Fall;Anterior Hip Precaution  Booklet Issued: Yes (comment) Restrictions Weight Bearing Restrictions: Yes RLE Weight Bearing: Weight bearing as tolerated    Mobility  Bed Mobility               General bed mobility comments: deferred, in recliner at start and end of session  Transfers Overall transfer level: Needs assistance Equipment used: Rolling walker (2 wheeled) Transfers: Sit to/from Stand Sit to Stand: Min guard         General transfer comment: min cueing for sequencing for return to sit; pt occasionally forgets to reach back for chair and has poor eccentric control w/o UE assist  Ambulation/Gait Ambulation/Gait assistance: Min guard Gait Distance (Feet): 25 Feet Assistive device: Rolling walker (2 wheeled) Gait Pattern/deviations: Step-to pattern;Decreased step length - left;Decreased stance time - right;Antalgic;Trunk flexed Gait velocity: decreased   General Gait Details: Pt req min cueing for sequencing and management of RW. Continued to have trunk flexed and L lateral lean, able to improve with cueing. Pt appeared more fatigued w/ ambulation today and appeared somewhat SOB after 66ft; but SpO2 and HR WFL    Stairs Stairs: Yes Stairs assistance: Min guard;+2 physical assistance Stair Management: One rail Right;Step to pattern;Forwards Number of Stairs: 3 General stair comments: Pt able to ascend 1 stair backwards and descend forwards x2 w/ RW; w/o cueing and minimal difficulty. Pt able to ascend/descend 3x stairs fowards w/ R rail and mod-max cueing for hand and foot placement; CGA   Wheelchair Mobility    Modified Rankin (Stroke Patients Only)  Balance Overall balance assessment: Needs assistance Sitting-balance support: No upper extremity supported;Feet supported Sitting balance-Leahy Scale: Good     Standing balance support: Bilateral upper extremity supported;During functional activity Standing balance-Leahy Scale: Good Standing balance comment: Pt demonstrated good  ability to shift weight through BLE w/ light BUE support on RW; no LOB or buckling noted although pt reports a history of leg buckling                            Cognition Arousal/Alertness: Awake/alert Behavior During Therapy: WFL for tasks assessed/performed Overall Cognitive Status: Within Functional Limits for tasks assessed                                 General Comments: A&Ox4; retired Immunologist Total Joint Exercises Ankle Circles/Pumps: AROM;Strengthening;Both;10 reps Lobbyist: AROM;Strengthening;Both;10 reps Straight Leg Raises: AAROM;Strengthening;Right;10 reps Long Arc Quad: AROM;Strengthening;Right;10 Theatre manager in Standing: AROM;Strengthening;Both;Standing;5 reps Other Exercises Other Exercises: education on HEP exercises and frequency Other Exercises: Education on sequencing for stair training w/ 1 step and for multiple steps Other Exercises: Education on sequencing for car transfers    General Comments        Pertinent Vitals/Pain Pain Assessment: 0-10 Pain Score: 3  Pain Location: R hip Pain Descriptors / Indicators: Aching;Sore Pain Intervention(s): Limited activity within patient's tolerance;Monitored during session;Premedicated before session;Repositioned    Home Living                      Prior Function            PT Goals (current goals can now be found in the care plan section) Progress towards PT goals: Progressing toward goals    Frequency    BID      PT Plan Current plan remains appropriate    Co-evaluation              AM-PAC PT "6 Clicks" Mobility   Outcome Measure  Help needed turning from your back to your side while in a flat bed without using bedrails?: None Help needed moving from lying on your back to sitting on the side of a flat bed without using bedrails?: A Little Help needed moving to and from a bed to a chair (including a wheelchair)?: A Little Help needed  standing up from a chair using your arms (e.g., wheelchair or bedside chair)?: A Little Help needed to walk in hospital room?: A Little Help needed climbing 3-5 steps with a railing? : A Little 6 Click Score: 19    End of Session Equipment Utilized During Treatment: Gait belt Activity Tolerance: Patient tolerated treatment well Patient left: with call bell/phone within reach;with SCD's reapplied;in chair;with chair alarm set Nurse Communication: Mobility status;Weight bearing status PT Visit Diagnosis: Unsteadiness on feet (R26.81);Other abnormalities of gait and mobility (R26.89);Muscle weakness (generalized) (M62.81);History of falling (Z91.81);Pain Pain - Right/Left: Right Pain - part of body: Hip     Time: 5170-0174 PT Time Calculation (min) (ACUTE ONLY): 29 min  Charges:                        Lakita Sahlin SPT 09/19/19, 1:21 PM

## 2019-09-19 NOTE — Discharge Summary (Signed)
Physician Discharge Summary  Patient ID: Victoria Crane MRN: [id number] DOB/AGE: [birth year] 68 y.o.  Admit date: 09/17/2019 Discharge date: 09/19/2019  Admission Diagnoses:  M16.11 Unilateral primary osteoarthritis, right hip <principal problem not specified>  Discharge Diagnoses:  M16.11 Unilateral primary osteoarthritis, right hip Active Problems:   History of total hip replacement, right   Past Medical History:  Diagnosis Date   Anemia    Arthritis    Cancer ([place])    BREAST RIGHT-COLON CA   Complication of anesthesia    GERD (gastroesophageal reflux disease)    Hypertension    PONV (postoperative nausea and vomiting)    NAUSEA A LONG TIME AGO   Thyroid goiter     Surgeries: Procedure(s): TOTAL HIP ARTHROPLASTY ANTERIOR APPROACH on 09/17/2019   Consultants (if any):   Discharged Condition: Improved  Hospital Course: Victoria Crane is an 69 y.o. female who was admitted 09/17/2019 with a diagnosis of  M16.11 Unilateral primary osteoarthritis, right hip <principal problem not specified> and went to the operating room on 09/17/2019 and underwent the above named procedures.    She was given perioperative antibiotics:  Anti-infectives (From admission, onward)   Start     Dose/Rate Route Frequency Ordered Stop   09/17/19 1215  ceFAZolin (ANCEF) IVPB 2g/100 mL premix        2 g 200 mL/hr over 30 Minutes Intravenous Every 6 hours 09/17/19 1127 09/18/19 0547   09/17/19 0933  50,000 units bacitracin in 0.9% normal saline 250 mL irrigation  Status:  Discontinued          As needed 09/17/19 0934 09/17/19 1030   09/17/19 0730  clindamycin (CLEOCIN) 900 MG/50ML IVPB       Note to Pharmacy: Norton Blizzard  : cabinet override      09/17/19 0730 09/17/19 0858   09/17/19 0715  clindamycin (CLEOCIN) IVPB 900 mg        900 mg 100 mL/hr over 30 Minutes Intravenous On call to O.R. 09/17/19 0701 09/17/19 0840    .  She was given sequential compression devices, early ambulation,  and aspirin for DVT prophylaxis.  She benefited maximally from the hospital stay and there were no complications.    Recent vital signs:  Vitals:   09/18/19 2309 09/19/19 0746  BP: (!) 112/52 (!) 116/59  Pulse: 85 75  Resp: 18 16  Temp: 98.5 F (36.9 C) 98.7 F (37.1 C)  SpO2: 96% 95%    Recent laboratory studies:  Lab Results  Component Value Date   HGB 7.7 (L) 09/19/2019   HGB 9.4 (L) 09/18/2019   HGB 7.1 (L) 09/18/2019   Lab Results  Component Value Date   WBC 9.2 09/19/2019   PLT 185 09/19/2019   Lab Results  Component Value Date   INR 1.0 09/05/2019   Lab Results  Component Value Date   NA 133 (L) 09/18/2019   K 4.9 09/18/2019   CL 104 09/18/2019   CO2 19 (L) 09/18/2019   BUN 16 09/18/2019   CREATININE 0.67 09/18/2019   GLUCOSE 102 (H) 09/18/2019    Discharge Medications:   Allergies as of 09/19/2019      Reactions   Augmentin [amoxicillin-pot Clavulanate] Itching   Codeine Other (See Comments)   unknown   Darvon [propoxyphene] Nausea And Vomiting   Lipitor [atorvastatin] Other (See Comments)   Leg cramps   Niacin And Related Hypertension   Reglan [metoclopramide] Other (See Comments)   Gi upset   Zocor [simvastatin] Other (See Comments)  Leg cramps   Ciprofloxacin Rash   Indocin [indomethacin] Rash   Iodinated Diagnostic Agents Rash   Sulfa Antibiotics Rash      Medication List    STOP taking these medications   diclofenac 75 MG EC tablet Commonly known as: VOLTAREN   naproxen sodium 220 MG tablet Commonly known as: ALEVE   vitamin E 1000 UNIT capsule     TAKE these medications   ascorbic acid 500 MG tablet Commonly known as: VITAMIN C Take 500 mg by mouth daily.   aspirin EC 81 MG tablet Take 1 tablet (81 mg total) by mouth in the morning and at bedtime. Swallow whole.   cetirizine 10 MG tablet Commonly known as: ZYRTEC Take 10 mg by mouth daily.   dicyclomine 10 MG capsule Commonly known as: BENTYL Take 10 mg by mouth 3  (three) times daily as needed for spasms.   docusate sodium 100 MG capsule Commonly known as: COLACE Take 1 capsule (100 mg total) by mouth 2 (two) times daily.   ezetimibe 10 MG tablet Commonly known as: ZETIA Take 10 mg by mouth at bedtime.   famotidine 40 MG tablet Commonly known as: PEPCID Take 1 tablet by mouth 2 (two) times daily.   ferrous sulfate 325 (65 FE) MG tablet Take 325 mg by mouth daily with breakfast.   FLUoxetine 20 MG capsule Commonly known as: PROZAC Take 20-40 mg by mouth at bedtime.   fluticasone 50 MCG/ACT nasal spray Commonly known as: FLONASE Place 1-2 sprays into both nostrils daily as needed for allergies.   gabapentin 300 MG capsule Commonly known as: NEURONTIN Take 600 mg by mouth 2 (two) times daily.   hydrochlorothiazide 25 MG tablet Commonly known as: HYDRODIURIL Take 12.5 mg by mouth every morning.   HYDROcodone-acetaminophen 5-325 MG tablet Commonly known as: NORCO/VICODIN Take 1 tablet by mouth every 4 (four) hours as needed for moderate pain (pain score 4-6). What changed:   when to take this  reasons to take this   IRON PO Take 1 tablet by mouth daily.   Lidocaine 4 % Ptch Place 1 application onto the skin daily as needed.   omeprazole 20 MG capsule Commonly known as: PRILOSEC Take 20 mg by mouth daily as needed (acid reflux).   ondansetron 4 MG tablet Commonly known as: ZOFRAN Take 1 tablet (4 mg total) by mouth every 6 (six) hours as needed for nausea.   potassium chloride SA 20 MEQ tablet Commonly known as: KLOR-CON Take 20 mEq by mouth daily.   pravastatin 10 MG tablet Commonly known as: PRAVACHOL Take 10 mg by mouth daily.            Durable Medical Equipment  (From admission, onward)         Start     Ordered   09/19/19 0845  For home use only DME 3 n 1  Once        09/19/19 0844   09/19/19 0845  For home use only DME Walker rolling  Once       Question Answer Comment  Walker: With 5 Inch Wheels    Patient needs a walker to treat with the following condition Osteoarthritis of right hip      09/19/19 0844   09/17/19 1128  DME Walker rolling  Once       Question:  Patient needs a walker to treat with the following condition  Answer:  History of total hip replacement, right   09/17/19 1127  09/17/19 1128  DME 3 n 1  Once        09/17/19 1127   09/17/19 1128  DME Bedside commode  Once       Question:  Patient needs a bedside commode to treat with the following condition  Answer:  History of total hip replacement, right   09/17/19 1127          Diagnostic Studies: DG HIP OPERATIVE UNILAT W OR W/O PELVIS RIGHT  Result Date: 09/17/2019 CLINICAL DATA:  Elective right hip surgery. EXAM: OPERATIVE right HIP (WITH PELVIS IF PERFORMED) 3 VIEWS TECHNIQUE: Fluoroscopic spot image(s) were submitted for interpretation post-operatively. FLUOROSCOPY TIME:  12 seconds. COMPARISON:  None. FINDINGS: Three intraoperative fluoroscopic images were obtained of the right hip. The right femoral and acetabular components appear to be well situated. IMPRESSION: Fluoroscopic guidance provided during right total hip arthroplasty. Electronically Signed   By: Marijo Conception M.D.   On: 09/17/2019 10:38    Disposition: Discharge disposition: 01-Home or Self Care          Follow-up Information    Duaine Dredge, MD Follow up.   Specialty: Internal Medicine Contact information: [place] [address] [phone]                Signed: Carlynn Spry ,PA-C 09/19/2019, 8:45 AM

## 2019-09-19 NOTE — Progress Notes (Signed)
RN to room to discuss discharge summary and concerns. Pt washing up. RN encouraged pt to call when done cleaning up so discharge summary could be discussed. Call bell within reach

## 2019-09-19 NOTE — Progress Notes (Signed)
Pt sitting in chair, reading. Pt states 3/10 pain but denies any PRN medication at this time. RN gave scheduled meds and assessed pt. Dressing to R hip clean and intact. Pt expressed her concerns of care. Pt denies any further needs at this time, call bell within reach, RN and Tech numbers on pt's board. PT in room at this time.

## 2019-09-19 NOTE — Progress Notes (Signed)
  Subjective:  Patient reports pain as mild to moderate.  HGB 7.1 to 9.4 to 7.7 this morning. Patient remains asymptomatic.  Objective:   VITALS:   Vitals:   09/18/19 1205 09/18/19 1528 09/18/19 2309 09/19/19 0746  BP: (!) 111/57 (!) 121/57 (!) 112/52 (!) 116/59  Pulse: 73 75 85 75  Resp: '18 18 18 16  '$ Temp: 97.7 F (36.5 C) 98.2 F (36.8 C) 98.5 F (36.9 C) 98.7 F (37.1 C)  TempSrc: Oral Oral Oral Oral  SpO2: 100% 100% 96% 95%  Weight:      Height:        PHYSICAL EXAM:  Neurologically intact ABD soft Neurovascular intact Sensation intact distally Intact pulses distally Dorsiflexion/Plantar flexion intact Incision: scant drainage No cellulitis present Compartment soft dressing changed  LABS  Results for orders placed or performed during the hospital encounter of 09/17/19 (from the past 24 hour(s))  Prepare RBC (crossmatch)     Status: None   Collection Time: 09/18/19  9:52 AM  Result Value Ref Range   Order Confirmation      ORDER PROCESSED BY BLOOD BANK Performed at [facility], [address] [place], [place] [address]   CBC     Status: Abnormal   Collection Time: 09/18/19 10:38 AM  Result Value Ref Range   WBC 13.4 (H) 4.0 - 10.5 K/uL   RBC 3.10 (L) 3.87 - 5.11 MIL/uL   Hemoglobin 9.4 (L) 12.0 - 15.0 g/dL   HCT 29.2 (L) 36 - 46 %   MCV 94.2 80.0 - 100.0 fL   MCH 30.3 26.0 - 34.0 pg   MCHC 32.2 30.0 - 36.0 g/dL   RDW 13.3 11.5 - 15.5 %   Platelets 260 150 - 400 K/uL   nRBC 0.0 0.0 - 0.2 %  CBC     Status: Abnormal   Collection Time: 09/19/19  6:48 AM  Result Value Ref Range   WBC 9.2 4.0 - 10.5 K/uL   RBC 2.57 (L) 3.87 - 5.11 MIL/uL   Hemoglobin 7.7 (L) 12.0 - 15.0 g/dL   HCT 24.2 (L) 36 - 46 %   MCV 94.2 80.0 - 100.0 fL   MCH 30.0 26.0 - 34.0 pg   MCHC 31.8 30.0 - 36.0 g/dL   RDW 13.4 11.5 - 15.5 %   Platelets 185 150 - 400 K/uL   nRBC 0.0 0.0 - 0.2 %    DG HIP OPERATIVE UNILAT W OR W/O PELVIS RIGHT  Result Date:  09/17/2019 CLINICAL DATA:  Elective right hip surgery. EXAM: OPERATIVE right HIP (WITH PELVIS IF PERFORMED) 3 VIEWS TECHNIQUE: Fluoroscopic spot image(s) were submitted for interpretation post-operatively. FLUOROSCOPY TIME:  12 seconds. COMPARISON:  None. FINDINGS: Three intraoperative fluoroscopic images were obtained of the right hip. The right femoral and acetabular components appear to be well situated. IMPRESSION: Fluoroscopic guidance provided during right total hip arthroplasty. Electronically Signed   By: Marijo Conception M.D.   On: 09/17/2019 10:38    Assessment/Plan: 2 Days Post-Op   Active Problems:   History of total hip replacement, right   Advance diet Up with therapy  D/C if PT goals met   Carlynn Spry , PA-C 09/19/2019, 8:19 AM

## 2019-09-19 NOTE — TOC Transition Note (Signed)
Transition of Care [facility]) - CM/SW Discharge Note   Patient Details  Name: Victoria Crane MRN: [id number] Date of Birth: [birth year]  [facility] CM/SW Contact:  Elease Hashimoto, LCSW Phone Number: 09/19/2019, 8:50 AM   Clinical Narrative:  Pt is medically ready to discharge today. She has arranged OPPT in [place]. Her son is coming to transport pt to his home in [facility], where she will be staying until she is healed and then will come back home. She has all needed equipment and feels ready for DC. No further follow due to DC today.     Final next level of care: OP Rehab Barriers to Discharge: No Barriers Identified   Patient Goals and CMS Choice Patient states their goals for this hospitalization and ongoing recovery are:: I plan to go to my son's home in [facility] eventually back home, when healed CMS Medicare.gov Compare Post Acute Care list provided to:: Patient Choice offered to / list presented to : Patient  Discharge Placement                Patient to be transferred to facility by: Tazz-son Name of family member notified: Donovan Kail Patient and family notified of of transfer: 09/19/19  Discharge Plan and Services In-house Referral: Clinical Social Work   Post Acute Care Choice: [place]: PT [place] Agency: [facility]) Date [place]: 09/17/19 Time [place]: 2 Representative spoke with at [place]: teresa  Social Determinants of Health ([place]) Interventions     Readmission Risk Interventions No flowsheet data found.

## 2019-09-19 NOTE — Discharge Instructions (Signed)
No tight elastic waist bands over the bandage. Not wearing underwear is preferred, or pull waist band above the bandage.  May shower with bandage in place.  If bandage becomes saturated, OK to removal and place band-aid.  You may be up walking around as tolerated but should take periodic breaks to elevate your legs.    Pain medication can cause constipation.  You should increase your fluid intake, increase your intake of high fiber foods and/or take Metamucil as needed for constipation.  You may shower.  You do NOT need to cover the dressing or incision site with plastic wrap.  The dressing or incision can get wet, but do not submerge under water.  After your staples have been removed, you should wait 48 hours before submerging incision under water.  Continue your physical therapy exercises at least twice daily.  It is a good idea to use an ice pack for 30 minutes after doing your exercises to reduce swelling.  Do not be surprised if you have increased pain at night.  This usually means you have been a little too active during the day and need to reduce your activities.  If you develop lower extremity swelling that does not improve after a night of elevation, please call the office.  This could be an early sign of a blood clot.  Call with questions, fever>101.5 degrees, shortness of breath or drainage from the wound  [phone] No tight elastic waist bands over the bandage. Not wearing underwear is preferred, or pull waist band above the bandage.  May shower with bandage in place.  If bandage becomes saturated, OK to removal and place band-aid.  You may be up walking around as tolerated but should take periodic breaks to elevate your legs.    Pain medication can cause constipation.  You should increase your fluid intake, increase your intake of high fiber foods and/or take Metamucil as needed for constipation.  You may shower.  You do NOT need to cover the dressing or incision site  with plastic wrap.  The dressing or incision can get wet, but do not submerge under water.  After your staples have been removed, you should wait 48 hours before submerging incision under water.  Continue your physical therapy exercises at least twice daily.  It is a good idea to use an ice pack for 30 minutes after doing your exercises to reduce swelling.  Do not be surprised if you have increased pain at night.  This usually means you have been a little too active during the day and need to reduce your activities.  If you develop lower extremity swelling that does not improve after a night of elevation, please call the office.  This could be an early sign of a blood clot.  Call with questions, fever>101.5 degrees, shortness of breath or drainage from the wound  [phone] No tight elastic waist bands over the bandage. Not wearing underwear is preferred, or pull waist band above the bandage.  May shower with bandage in place.  If bandage becomes saturated, OK to removal and place band-aid.  You may be up walking around as tolerated but should take periodic breaks to elevate your legs.    Pain medication can cause constipation.  You should increase your fluid intake, increase your intake of high fiber foods and/or take Metamucil as needed for constipation.  You may shower.  You do NOT need to cover the dressing or incision site with plastic wrap.  The dressing or incision  can get wet, but do not submerge under water.  After your staples have been removed, you should wait 48 hours before submerging incision under water.  Continue your physical therapy exercises at least twice daily.  It is a good idea to use an ice pack for 30 minutes after doing your exercises to reduce swelling.  Do not be surprised if you have increased pain at night.  This usually means you have been a little too active during the day and need to reduce your activities.  If you develop lower extremity swelling that  does not improve after a night of elevation, please call the office.  This could be an early sign of a blood clot.  Call with questions, fever>101.5 degrees, shortness of breath or drainage from the wound  [phone]

## 2019-09-19 NOTE — Plan of Care (Signed)
  Problem: Health Behavior/Discharge Planning: Goal: Ability to manage health-related needs will improve Outcome: Progressing   Problem: Clinical Measurements: Goal: Ability to maintain clinical measurements within normal limits will improve Outcome: Progressing Goal: Will remain free from infection Outcome: Progressing   Problem: Activity: Goal: Risk for activity intolerance will decrease Outcome: Progressing   Problem: Nutrition: Goal: Adequate nutrition will be maintained Outcome: Progressing   Problem: Coping: Goal: Level of anxiety will decrease Outcome: Progressing   Problem: Elimination: Goal: Will not experience complications related to bowel motility Outcome: Progressing Goal: Will not experience complications related to urinary retention Outcome: Progressing   Problem: Pain Managment: Goal: General experience of comfort will improve Outcome: Progressing   Problem: Safety: Goal: Ability to remain free from injury will improve Outcome: Progressing   Problem: Skin Integrity: Goal: Risk for impaired skin integrity will decrease Outcome: Progressing   

## 2020-06-02 ENCOUNTER — Emergency Department
Admission: EM | Admit: 2020-06-02 | Discharge: 2020-06-02 | Disposition: A | Discharge status: Home / Self Care | Payer: Medicare PPO | Attending: Emergency Medicine | Admitting: Emergency Medicine

## 2020-06-02 ENCOUNTER — Emergency Department: Payer: Medicare PPO

## 2020-06-02 ENCOUNTER — Other Ambulatory Visit: Payer: Self-pay

## 2020-06-02 ENCOUNTER — Encounter: Payer: Self-pay | Admitting: Emergency Medicine

## 2020-06-02 DIAGNOSIS — Z96643 Presence of artificial hip joint, bilateral: Secondary | ICD-10-CM | POA: Insufficient documentation

## 2020-06-02 DIAGNOSIS — Z87891 Personal history of nicotine dependence: Secondary | ICD-10-CM | POA: Insufficient documentation

## 2020-06-02 DIAGNOSIS — R109 Unspecified abdominal pain: Secondary | ICD-10-CM | POA: Insufficient documentation

## 2020-06-02 DIAGNOSIS — I10 Essential (primary) hypertension: Secondary | ICD-10-CM | POA: Diagnosis not present

## 2020-06-02 DIAGNOSIS — Z79899 Other long term (current) drug therapy: Secondary | ICD-10-CM | POA: Insufficient documentation

## 2020-06-02 DIAGNOSIS — Z853 Personal history of malignant neoplasm of breast: Secondary | ICD-10-CM | POA: Diagnosis not present

## 2020-06-02 DIAGNOSIS — R350 Frequency of micturition: Secondary | ICD-10-CM | POA: Insufficient documentation

## 2020-06-02 DIAGNOSIS — Z96653 Presence of artificial knee joint, bilateral: Secondary | ICD-10-CM | POA: Insufficient documentation

## 2020-06-02 LAB — CBC WITH DIFFERENTIAL/PLATELET
Abs Immature Granulocytes: 0.04 10*3/uL (ref 0.00–0.07)
Basophils Absolute: 0 10*3/uL (ref 0.0–0.1)
Basophils Relative: 0 %
Eosinophils Absolute: 0.1 10*3/uL (ref 0.0–0.5)
Eosinophils Relative: 2 %
HCT: 41.5 % (ref 36.0–46.0)
Hemoglobin: 13.5 g/dL (ref 12.0–15.0)
Immature Granulocytes: 1 %
Lymphocytes Relative: 11 %
Lymphs Abs: 0.8 10*3/uL (ref 0.7–4.0)
MCH: 30 pg (ref 26.0–34.0)
MCHC: 32.5 g/dL (ref 30.0–36.0)
MCV: 92.2 fL (ref 80.0–100.0)
Monocytes Absolute: 0.5 10*3/uL (ref 0.1–1.0)
Monocytes Relative: 6 %
Neutro Abs: 5.9 10*3/uL (ref 1.7–7.7)
Neutrophils Relative %: 80 %
Platelets: 236 10*3/uL (ref 150–400)
RBC: 4.5 MIL/uL (ref 3.87–5.11)
RDW: 13.1 % (ref 11.5–15.5)
WBC: 7.3 10*3/uL (ref 4.0–10.5)
nRBC: 0 % (ref 0.0–0.2)

## 2020-06-02 LAB — COMPREHENSIVE METABOLIC PANEL
ALT: 10 U/L (ref 0–44)
AST: 20 U/L (ref 15–41)
Albumin: 4 g/dL (ref 3.5–5.0)
Alkaline Phosphatase: 90 U/L (ref 38–126)
Anion gap: 8 (ref 5–15)
BUN: 22 mg/dL (ref 8–23)
CO2: 25 mmol/L (ref 22–32)
Calcium: 9.1 mg/dL (ref 8.9–10.3)
Chloride: 107 mmol/L (ref 98–111)
Creatinine, Ser: 0.75 mg/dL (ref 0.44–1.00)
GFR, Estimated: >60 mL/min (ref >60)
Glucose, Bld: 112 mg/dL — ABNORMAL HIGH (ref 70–99)
Potassium: 4.4 mmol/L (ref 3.5–5.1)
Sodium: 140 mmol/L (ref 135–145)
Total Bilirubin: 0.7 mg/dL (ref 0.3–1.2)
Total Protein: 7.7 g/dL (ref 6.5–8.1)

## 2020-06-02 LAB — URINALYSIS, COMPLETE (UACMP) WITH MICROSCOPIC
Bacteria, UA: NONE SEEN
Bilirubin Urine: NEGATIVE
Glucose, UA: NEGATIVE mg/dL
Hgb urine dipstick: NEGATIVE
Ketones, ur: NEGATIVE mg/dL
Leukocytes,Ua: NEGATIVE
Nitrite: NEGATIVE
Protein, ur: NEGATIVE mg/dL
Specific Gravity, Urine: 1.018 (ref 1.005–1.030)
pH: 7 (ref 5.0–8.0)

## 2020-06-02 MED ORDER — LACTATED RINGERS IV BOLUS
1000.0000 mL | Freq: Once | INTRAVENOUS | Status: AC
  Administered 2020-06-02: 1000 mL via INTRAVENOUS

## 2020-06-02 NOTE — ED Triage Notes (Signed)
Pt to ED via EMS from home. Per EMS, pt had increase symptoms of uti symptoms. Pt recently went to the doctor and blood was found urine and pt also had an increase white count. Pt has hx of recurrent UTI. Pt is c/o of pain in lower abd bilaterally.   EMS vitals; O2 97% on room air, BP: 152/106, HR 107

## 2020-06-02 NOTE — ED Provider Notes (Signed)
[facility] Emergency Department Provider Note ____________________________________________   Event Date/Time   First MD Initiated Contact with Patient 06/02/20 1921     (approximate)  I have reviewed the triage vital signs and the nursing notes.  HISTORY  Chief Complaint Urinary Frequency   HPI Victoria Crane is a 70 y.o. femalewho presents to the ED for evaluation of urinary frequency.  Chart review indicates history of HTN, obesity, arthritis and right-sided breast cancer.  Patient presents to the ED for evaluation of urinary frequency and expressed concern for UTI.  She reports going to her PCP couple weeks ago, having "a little bit" microscopic blood and leukocytes in her urine, but was not provided an antibiotic.  She reports that she is not currently on antibiotics or have been prescribed any recently.  She reports a few days of urinary frequency and concerned that this may represent a UTI.  She denies any dysuria, hematuria, flank pain, fever, emesis, vaginal discharge or bleeding.  She does indicate a "mild pressure" to her lower abdomen at the midline.  While she does not tell me this initially, as indicated below, when I reevaluate the patient after imaging, she does ask me about her "bladder falling down" that she has been noticing along a similar timeframe.  She declines my offer to take a look to evaluate for prolapse of her bladder.  She denies any vaginal bleeding or discharge.   Past Medical History:  Diagnosis Date  . Anemia   . Arthritis   . Cancer ([place])    BREAST RIGHT-COLON CA  . Complication of anesthesia   . GERD (gastroesophageal reflux disease)   . Hypertension   . PONV (postoperative nausea and vomiting)    NAUSEA A LONG TIME AGO  . Thyroid goiter     Patient Active Problem List   Diagnosis Date Noted  . 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  ABDOMINAL HYSTERECTOMY    . BACK SURGERY     MULTIPLE BACK SURGERIES  . BREAST SURGERY    . COLON SURGERY     DUE TO CANCER  . COLONOSCOPY    . HIP SURGERY Left   . JOINT REPLACEMENT Left    hip  . JOINT REPLACEMENT Bilateral    knees  . KNEE SURGERY Bilateral   . MASTECTOMY    . TOTAL HIP ARTHROPLASTY Right 09/17/2019   Procedure: TOTAL HIP ARTHROPLASTY ANTERIOR APPROACH;  Surgeon: Lovell Sheehan, MD;  Location: [facility] ORS;  Service: Orthopedics;  Laterality: Right;    Prior to Admission medications   Medication Sig Start Date End Date Taking? Authorizing Provider  ascorbic acid (VITAMIN C) 500 MG tablet Take 500 mg by mouth daily.    [provider]  aspirin EC 81 MG tablet Take 1 tablet (81 mg total) by mouth in the morning and at bedtime. Swallow whole. 09/19/19   Carlynn Spry, PA-C  cetirizine (ZYRTEC) 10 MG tablet Take 10 mg by mouth daily.  Patient not taking: Reported on 09/01/2019    [provider]  dicyclomine (BENTYL) 10 MG capsule Take 10 mg by mouth 3 (three) times daily as needed for spasms.  05/15/16   [provider]  docusate sodium (COLACE) 100 MG capsule Take 1 capsule (100 mg total) by mouth 2 (two) times daily. 09/19/19   Carlynn Spry, PA-C  ezetimibe (ZETIA) 10 MG tablet Take 10 mg by mouth at  bedtime.    [provider]  famotidine (PEPCID) 40 MG tablet Take 1 tablet by mouth 2 (two) times daily. Patient not taking: Reported on 09/01/2019 01/25/15   [provider]  ferrous sulfate 325 (65 FE) MG tablet Take 325 mg by mouth daily with breakfast.    [provider]  FLUoxetine (PROZAC) 20 MG capsule Take 20-40 mg by mouth at bedtime.  06/13/13   [provider]  fluticasone (FLONASE) 50 MCG/ACT nasal spray Place 1-2 sprays into both nostrils daily as needed for allergies.  09/27/09   [provider]  gabapentin (NEURONTIN) 300 MG capsule Take 600 mg by mouth 2 (two) times daily.  05/21/13   [provider]  hydrochlorothiazide (HYDRODIURIL) 25 MG tablet Take 12.5 mg by mouth every morning.  05/16/13   [provider]  HYDROcodone-acetaminophen (NORCO/VICODIN) 5-325 MG tablet Take 1 tablet by mouth every 4 (four) hours as needed for moderate pain (pain score 4-6). 09/19/19   Carlynn Spry, PA-C  IRON PO Take 1 tablet by mouth daily. Patient not taking: Reported on 09/01/2019    [provider]  Lidocaine 4 % PTCH Place 1 application onto the skin daily as needed.    [provider]  omeprazole (PRILOSEC) 20 MG capsule Take 20 mg by mouth daily as needed (acid reflux).    [provider]  ondansetron (ZOFRAN) 4 MG tablet Take 1 tablet (4 mg total) by mouth every 6 (six) hours as needed for nausea. 09/19/19   Carlynn Spry, PA-C  potassium chloride SA (KLOR-CON) 20 MEQ tablet Take 20 mEq by mouth daily. 07/29/19   [provider]  pravastatin (PRAVACHOL) 10 MG tablet Take 10 mg by mouth daily. Patient not taking: Reported on 09/01/2019    [provider]    Allergies Augmentin [amoxicillin-pot clavulanate], Codeine, Darvon [propoxyphene], Lipitor [atorvastatin], Niacin and related, Reglan [metoclopramide], Zocor [simvastatin], Ciprofloxacin, Indocin [indomethacin], Iodinated diagnostic agents, and Sulfa antibiotics  History reviewed. No pertinent family history.  Social History Social History   Tobacco Use  . Smoking status: Former Smoker    Packs/day: 0.25    Years: 20.00    Pack years: 5.00    Types: Cigarettes    Quit date: 09/05/1994    Years since quitting: 25.7  . Smokeless tobacco: Never Used  . Tobacco comment: MORE SOCIAL SMOKING  Vaping Use  . Vaping Use: Never used  Substance Use Topics  . Alcohol use: Not Currently  . Drug use: Never    Review of Systems  Constitutional: No fever/chills Eyes: No visual changes. ENT: No sore throat. Cardiovascular: Denies chest pain. Respiratory: Denies shortness of  breath. Gastrointestinal: No abdominal pain.  No nausea, no vomiting.  No diarrhea.  No constipation. Genitourinary: Negative for dysuria.  Positive for urinary frequency Musculoskeletal: Negative for back pain. Skin: Negative for rash. Neurological: Negative for headaches, focal weakness or numbness.  ____________________________________________   PHYSICAL EXAM:  VITAL SIGNS: Vitals:   06/02/20 1927 06/02/20 2030  BP: 125/68 115/76  Pulse: 98 85  Resp: 20 14  Temp: 99 F (37.2 C)   SpO2: 97% 96%     Constitutional: Alert and oriented. Well appearing and in no acute distress. Eyes: Conjunctivae are normal. PERRL. EOMI. Head: Atraumatic. Nose: No congestion/rhinnorhea. Mouth/Throat: Mucous membranes are moist.  Oropharynx non-erythematous. Neck: No stridor. No cervical spine tenderness to palpation. Cardiovascular: Normal rate, regular rhythm. Grossly normal heart sounds.  Good peripheral circulation. Respiratory: Normal respiratory effort.  No retractions.  Lungs CTAB. Gastrointestinal: Soft , nondistended. No CVA tenderness. Minimal suprapubic pressure to palpation, but no further tenderness and is benign abdomen Musculoskeletal: No lower extremity tenderness nor edema.  No joint effusions. No signs of acute trauma. Neurologic:  Normal speech and language. No gross focal neurologic deficits are appreciated. No gait instability noted. Skin:  Skin is warm, dry and intact. No rash noted. Psychiatric: Mood and affect are normal. Speech and behavior are normal.  ____________________________________________   LABS (all labs ordered are listed, but only abnormal results are displayed)  Labs Reviewed  URINALYSIS, COMPLETE (UACMP) WITH MICROSCOPIC - Abnormal; Notable for the following components:      Result Value   Color, Urine YELLOW (*)    APPearance CLEAR (*)    All other components within normal limits  COMPREHENSIVE METABOLIC PANEL - Abnormal; Notable for the following  components:   Glucose, Bld 112 (*)    All other components within normal limits  CBC WITH DIFFERENTIAL/PLATELET   ____________________________________________  RADIOLOGY  ED MD interpretation: CT renal study reviewed by me without evidence of ureterolithiasis or obstruction  Official radiology report(s): No results found.  ____________________________________________   PROCEDURES and INTERVENTIONS  Procedure(s) performed (including Critical Care):  .1-3 Lead EKG Interpretation Performed by: Vladimir Crofts, MD Authorized by: Vladimir Crofts, MD     Interpretation: normal     ECG rate:  90   ECG rate assessment: normal     Rhythm: sinus rhythm     Ectopy: none     Conduction: normal      Medications  lactated ringers bolus 1,000 mL (1,000 mLs Intravenous New Bag/Given 06/02/20 1936)    ____________________________________________   MDM / ED COURSE   69 year old woman presents with urinary frequency without evidence of UTI or ureteral stone, possibly due to bladder prolapse, and amenable to outpatient management.  Normal vitals on room air.  Exam is very reassuring.  She has some minimal pressure with deep palpation to her suprapubic abdomen, but otherwise benign exam without derangements.  No evidence of acute cystitis, pyelonephritis, ureteral stone or any urologic obstruction.  I offered to evaluate for bladder prolapse, but she reports preference to follow-up with her OB/GYN as an outpatient.  We discussed return precautions for the ED prior to discharge.  We discussed Kegel exercises.   Clinical Course as of 06/02/20 2223  Wed Jun 02, 2020  2217 Reassessed.  Patient was feeling well.  We discussed benign work-up.  Patient reports relief and does ask me if her "bladder falling out" could be Causing the symptoms.  She indicates that she has seen no external protrusion from her vagina, but indicates that while she is "washing down there" she says that "it feels a little bit  different."  I offered to take a look, but she declines indicating that she will follow-up with her OB/GYN.  We discussed return precautions for the ED. [DS]    Clinical Course User Index [DS] Vladimir Crofts, MD    ____________________________________________   FINAL CLINICAL IMPRESSION(S) / ED DIAGNOSES  Final diagnoses:  Urinary frequency     ED Discharge Orders    None       Annaliza Zia Tamala Julian   Note:  This document was prepared using Dragon voice recognition software and may include unintentional dictation errors.   Vladimir Crofts, MD 06/02/20 2225

## 2020-06-02 NOTE — Discharge Instructions (Signed)
As discussed, please follow-up with your OB/GYN to talk about the possibility of bladder prolapse, or your bladder falling down.   I have attached instructions on Kegel exercises that you can do at home that can help strengthen the muscles of your pelvic floor and prevent this, if it is present.  Return to the ED with any further worsening symptoms or persistent fevers

## 2020-06-02 NOTE — ED Notes (Signed)
Pt signed on paper

## 2020-06-22 ENCOUNTER — Other Ambulatory Visit: Payer: Self-pay

## 2020-06-22 ENCOUNTER — Encounter: Payer: Self-pay | Admitting: *Deleted

## 2020-06-22 ENCOUNTER — Emergency Department: Payer: Medicare PPO

## 2020-06-22 ENCOUNTER — Emergency Department
Admission: EM | Admit: 2020-06-22 | Discharge: 2020-06-22 | Disposition: A | Discharge status: Home / Self Care | Payer: Medicare PPO | Attending: Emergency Medicine | Admitting: Emergency Medicine

## 2020-06-22 DIAGNOSIS — Z96653 Presence of artificial knee joint, bilateral: Secondary | ICD-10-CM | POA: Diagnosis not present

## 2020-06-22 DIAGNOSIS — Z853 Personal history of malignant neoplasm of breast: Secondary | ICD-10-CM | POA: Diagnosis not present

## 2020-06-22 DIAGNOSIS — Y92009 Unspecified place in unspecified non-institutional (private) residence as the place of occurrence of the external cause: Secondary | ICD-10-CM | POA: Diagnosis not present

## 2020-06-22 DIAGNOSIS — Z87891 Personal history of nicotine dependence: Secondary | ICD-10-CM | POA: Insufficient documentation

## 2020-06-22 DIAGNOSIS — Z96643 Presence of artificial hip joint, bilateral: Secondary | ICD-10-CM | POA: Insufficient documentation

## 2020-06-22 DIAGNOSIS — Z79899 Other long term (current) drug therapy: Secondary | ICD-10-CM | POA: Insufficient documentation

## 2020-06-22 DIAGNOSIS — M25552 Pain in left hip: Secondary | ICD-10-CM | POA: Insufficient documentation

## 2020-06-22 DIAGNOSIS — W050XXA Fall from non-moving wheelchair, initial encounter: Secondary | ICD-10-CM | POA: Diagnosis not present

## 2020-06-22 DIAGNOSIS — Z7982 Long term (current) use of aspirin: Secondary | ICD-10-CM | POA: Insufficient documentation

## 2020-06-22 DIAGNOSIS — I10 Essential (primary) hypertension: Secondary | ICD-10-CM | POA: Insufficient documentation

## 2020-06-22 DIAGNOSIS — W19XXXA Unspecified fall, initial encounter: Secondary | ICD-10-CM

## 2020-06-22 MED ORDER — ONDANSETRON 4 MG PO TBDP
4.0000 mg | ORAL_TABLET | Freq: Once | ORAL | Status: AC
  Administered 2020-06-22: 4 mg via ORAL
  Filled 2020-06-22: qty 1

## 2020-06-22 MED ORDER — HYDROCODONE-ACETAMINOPHEN 5-325 MG PO TABS
1.0000 | ORAL_TABLET | Freq: Once | ORAL | Status: AC
  Administered 2020-06-22: 1 via ORAL
  Filled 2020-06-22: qty 1

## 2020-06-22 MED ORDER — FENTANYL CITRATE (PF) 100 MCG/2ML IJ SOLN
50.0000 ug | Freq: Once | INTRAMUSCULAR | Status: AC
  Administered 2020-06-22: 50 ug via INTRAMUSCULAR
  Filled 2020-06-22: qty 2

## 2020-06-22 NOTE — ED Triage Notes (Signed)
Pt brought in via ems from home.  Pt fell tonight in side the house.  Pt has lower back pain and left hip pain. Pt ambulatory on the scene.  Pt alert  Speech clear.

## 2020-06-22 NOTE — ED Notes (Signed)
Patient able to ambulate slowly with walker

## 2020-06-22 NOTE — Discharge Instructions (Signed)
You may take Norco as needed for pain.  Use your walker to help you balance as you walk.  Return to the ER for worsening symptoms, persistent vomiting, difficulty breathing or other concerns.

## 2020-06-22 NOTE — ED Provider Notes (Signed)
[facility] Emergency Department Provider Note   ____________________________________________   Event Date/Time   First MD Initiated Contact with Patient 06/22/20 0101     (approximate)  I have reviewed the triage vital signs and the nursing notes.   HISTORY  Chief Complaint Back Pain and Hip Pain    HPI Victoria Crane is a 70 y.o. female brought to the ED via EMS from home status post mechanical fall with left hip pain.  Patient pivoted with her walker which caught on the floor and she fell, striking her left hip.  Denies striking head or LOC.  Denies vision changes, neck pain, chest pain, shortness of breath, abdominal pain, nausea, vomiting or dizziness.  Denies anticoagulant use.  Patient ambulatory at scene.     Past Medical History:  Diagnosis Date  . Anemia   . Arthritis   . Cancer ([place])    BREAST RIGHT-COLON CA  . Complication of anesthesia   . GERD (gastroesophageal reflux disease)   . Hypertension   . PONV (postoperative nausea and vomiting)    NAUSEA A LONG TIME AGO  . Thyroid goiter     Patient Active Problem List   Diagnosis Date Noted  . 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 ABDOMINAL HYSTERECTOMY    . BACK SURGERY     MULTIPLE BACK SURGERIES  . BREAST SURGERY    . COLON SURGERY     DUE TO CANCER  . COLONOSCOPY    . HIP SURGERY Left   . JOINT REPLACEMENT Left    hip  . JOINT REPLACEMENT Bilateral    knees  . KNEE SURGERY Bilateral   . MASTECTOMY    . TOTAL HIP ARTHROPLASTY Right 09/17/2019   Procedure: TOTAL HIP ARTHROPLASTY ANTERIOR APPROACH;  Surgeon: Lovell Sheehan, MD;  Location: [facility] ORS;  Service: Orthopedics;  Laterality: Right;    Prior to Admission medications   Medication Sig Start Date End Date Taking? Authorizing Provider  ascorbic acid (VITAMIN C) 500 MG tablet Take 500 mg by mouth daily.    [provider]  aspirin  EC 81 MG tablet Take 1 tablet (81 mg total) by mouth in the morning and at bedtime. Swallow whole. 09/19/19   Carlynn Spry, PA-C  cetirizine (ZYRTEC) 10 MG tablet Take 10 mg by mouth daily.  Patient not taking: Reported on 09/01/2019    [provider]  dicyclomine (BENTYL) 10 MG capsule Take 10 mg by mouth 3 (three) times daily as needed for spasms.  05/15/16   [provider]  docusate sodium (COLACE) 100 MG capsule Take 1 capsule (100 mg total) by mouth 2 (two) times daily. 09/19/19   Carlynn Spry, PA-C  ezetimibe (ZETIA) 10 MG tablet Take 10 mg by mouth at bedtime.    [provider]  famotidine (PEPCID) 40 MG tablet Take 1 tablet by mouth 2 (two) times daily. Patient not taking: Reported on 09/01/2019 01/25/15   [provider]  ferrous sulfate 325 (65 FE) MG tablet Take 325 mg by mouth daily with breakfast.    [provider]  FLUoxetine (PROZAC) 20 MG capsule Take 20-40 mg by mouth at bedtime.  06/13/13   [provider]  fluticasone (FLONASE) 50 MCG/ACT nasal spray Place 1-2 sprays into both nostrils daily as needed for allergies.  09/27/09   [provider]  gabapentin (NEURONTIN) 300 MG capsule Take 600 mg by mouth  2 (two) times daily.  05/21/13   [provider]  hydrochlorothiazide (HYDRODIURIL) 25 MG tablet Take 12.5 mg by mouth every morning.  05/16/13   [provider]  HYDROcodone-acetaminophen (NORCO/VICODIN) 5-325 MG tablet Take 1 tablet by mouth every 4 (four) hours as needed for moderate pain (pain score 4-6). 09/19/19   Carlynn Spry, PA-C  IRON PO Take 1 tablet by mouth daily. Patient not taking: Reported on 09/01/2019    [provider]  Lidocaine 4 % PTCH Place 1 application onto the skin daily as needed.    [provider]  omeprazole (PRILOSEC) 20 MG capsule Take 20 mg by mouth daily as needed (acid reflux).    [provider]  ondansetron (ZOFRAN) 4 MG tablet Take 1 tablet (4 mg  total) by mouth every 6 (six) hours as needed for nausea. 09/19/19   Carlynn Spry, PA-C  potassium chloride SA (KLOR-CON) 20 MEQ tablet Take 20 mEq by mouth daily. 07/29/19   [provider]  pravastatin (PRAVACHOL) 10 MG tablet Take 10 mg by mouth daily. Patient not taking: Reported on 09/01/2019    [provider]    Allergies Augmentin [amoxicillin-pot clavulanate], Codeine, Darvon [propoxyphene], Lipitor [atorvastatin], Niacin and related, Reglan [metoclopramide], Zocor [simvastatin], Ciprofloxacin, Indocin [indomethacin], Iodinated diagnostic agents, and Sulfa antibiotics  No family history on file.  Social History Social History   Tobacco Use  . Smoking status: Former Smoker    Packs/day: 0.25    Years: 20.00    Pack years: 5.00    Types: Cigarettes    Quit date: 09/05/1994    Years since quitting: 25.8  . Smokeless tobacco: Never Used  . Tobacco comment: MORE SOCIAL SMOKING  Vaping Use  . Vaping Use: Never used  Substance Use Topics  . Alcohol use: Not Currently  . Drug use: Never    Review of Systems  Constitutional: No fever/chills Eyes: No visual changes. ENT: No sore throat. Cardiovascular: Denies chest pain. Respiratory: Denies shortness of breath. Gastrointestinal: No abdominal pain.  No nausea, no vomiting.  No diarrhea.  No constipation. Genitourinary: Negative for dysuria. Musculoskeletal: Positive for left hip pain.  Negative for back pain. Skin: Negative for rash. Neurological: Negative for headaches, focal weakness or numbness.   ____________________________________________   PHYSICAL EXAM:  VITAL SIGNS: ED Triage Vitals [06/22/20 0028]  Enc Vitals Group     BP (!) 103/55     Pulse Rate 78     Resp 18     Temp 98.4 F (36.9 C)     Temp Source Oral     SpO2 97 %     Weight 210 lb (95.3 kg)     Height 5\' 1"  (1.549 m)     Head Circumference      Peak Flow      Pain Score 6     Pain Loc      Pain Edu?      Excl. in [place]?      Constitutional: Alert and oriented. Well appearing and in mild acute distress. Eyes: Conjunctivae are normal. PERRL. EOMI. Head: Atraumatic. Nose: Atraumatic. Mouth/Throat: Mucous membranes are moist.  No dental malocclusion. Neck: No stridor.  No cervical spine tenderness to palpation. Cardiovascular: Normal rate, regular rhythm. Grossly normal heart sounds.  Good peripheral circulation. Respiratory: Normal respiratory effort.  No retractions. Lungs CTAB. Gastrointestinal: Soft and nontender. No distention. No abdominal bruits. No CVA tenderness. Musculoskeletal: Left hip minimally tender to palpation with full range of motion. There is  no shortening or rotation.  2+ distal pulses.  Brisk, less than 5-second capillary refill. Neurologic:  Normal speech and language. No gross focal neurologic deficits are appreciated.  Skin:  Skin is warm, dry and intact. No rash noted. Psychiatric: Mood and affect are normal. Speech and behavior are normal.  ____________________________________________   LABS (all labs ordered are listed, but only abnormal results are displayed)  Labs Reviewed - No data to display ____________________________________________  EKG  None ____________________________________________  RADIOLOGY I, Eulla Kochanowski J, personally viewed and evaluated these images (plain radiographs) as part of my medical decision making, as well as reviewing the written report by the radiologist.  ED MD interpretation: No fracture/dislocation  Official radiology report(s): DG Hip Unilat With Pelvis 2-3 Views Left  Result Date: 06/22/2020 CLINICAL DATA:  Status post fall. EXAM: DG HIP (WITH OR WITHOUT PELVIS) 2-3V LEFT COMPARISON:  CT abdomen pelvis 05/05/2009, x-ray hip 09/17/2019 FINDINGS: Status post bilateral total hip arthroplasties. Partially visualized lumbosacral surgical hardware. Sacroiliac surgical hardware is also present with the bilateral iliac screws demonstrating  surrounding lucencies. There is no evidence of left hip fracture or dislocation. No acute displaced fracture or diastasis of the pelvic bones. Unable to evaluate the sacrum due to overlying bowel gas. There is no aggressive appearing focal bone abnormality. IMPRESSION: 1. No acute displaced fracture or diastasis of the bones of the pelvis in a patient status post sacroiliac surgical hardware. 2. Bilateral iliac screws of the sacroiliac surgical hardware demonstrate surrounding lucencies which is concerning for loosening. Comparison with more recent pelvic x-ray would be of value to evaluate chronicity of the findings. 3. No acute displaced fracture or dislocation of the left hip in a patient status post total hip arthroplasty. 4. Grossly unremarkable frontal view of a partially visualized right hip total arthroplasty. 5. Limited evaluation of CT lumbosacral surgical hardware. Electronically Signed   By: Iven Finn M.D.   On: 06/22/2020 01:12    Left hip x-rays interpreted per Dr. Mckinley Jewel: 1. No acute displaced fracture or diastasis of the bones of the pelvis in a patient status post sacroiliac surgical hardware. 2. Bilateral iliac screws of the sacroiliac surgical hardware demonstrate surrounding lucencies which is concerning for loosening. Comparison with more recent pelvic x-ray would be of value to evaluate chronicity of the findings. 3. No acute displaced fracture or dislocation of the left hip in a patient status post total hip arthroplasty. 4. Grossly unremarkable frontal view of a partially visualized right hip total arthroplasty. 5. Limited evaluation of CT lumbosacral surgical hardware. ____________________________________________   PROCEDURES  Procedure(s) performed (including Critical Care):  Procedures   ____________________________________________   INITIAL IMPRESSION / ASSESSMENT AND PLAN / ED COURSE  As part of my medical decision making, I reviewed the following data  within the [facility] notes reviewed and incorporated, Old chart reviewed, Radiograph reviewed and Notes from prior ED visits     70 year old female presenting status post mechanical fall with left hip pain.  Will obtain x-rays.  Administer analgesia and reassess.  Clinical Course as of 06/22/20 0441  Tue Jun 22, 2020  0349 Patient ambulated with walker.  Strict return precautions given.  Patient verbalizes understanding agrees with plan of care. [JS]    Clinical Course User Index [JS] Paulette Blanch, MD     ____________________________________________   FINAL CLINICAL IMPRESSION(S) / ED DIAGNOSES  Final diagnoses:  Fall, initial encounter  Left hip pain     ED Discharge Orders  None      *Please note:  Annick Dimaio was evaluated in Emergency Department on 06/22/2020 for the symptoms described in the history of present illness. She was evaluated in the context of the global COVID-19 pandemic, which necessitated consideration that the patient might be at risk for infection with the SARS-CoV-2 virus that causes COVID-19. Institutional protocols and algorithms that pertain to the evaluation of patients at risk for COVID-19 are in a state of rapid change based on information released by regulatory bodies including the CDC and federal and state organizations. These policies and algorithms were followed during the patient's care in the ED.  Some ED evaluations and interventions may be delayed as a result of limited staffing during and the pandemic.*   Note:  This document was prepared using Dragon voice recognition software and may include unintentional dictation errors.   Paulette Blanch, MD 06/22/20 [phone]

## 2020-07-18 ENCOUNTER — Other Ambulatory Visit: Payer: Self-pay

## 2020-07-18 DIAGNOSIS — Z96653 Presence of artificial knee joint, bilateral: Secondary | ICD-10-CM | POA: Diagnosis not present

## 2020-07-18 DIAGNOSIS — N390 Urinary tract infection, site not specified: Secondary | ICD-10-CM | POA: Diagnosis not present

## 2020-07-18 DIAGNOSIS — Y92009 Unspecified place in unspecified non-institutional (private) residence as the place of occurrence of the external cause: Secondary | ICD-10-CM | POA: Insufficient documentation

## 2020-07-18 DIAGNOSIS — W19XXXA Unspecified fall, initial encounter: Secondary | ICD-10-CM | POA: Insufficient documentation

## 2020-07-18 DIAGNOSIS — Z853 Personal history of malignant neoplasm of breast: Secondary | ICD-10-CM | POA: Diagnosis not present

## 2020-07-18 DIAGNOSIS — Z96643 Presence of artificial hip joint, bilateral: Secondary | ICD-10-CM | POA: Insufficient documentation

## 2020-07-18 DIAGNOSIS — I10 Essential (primary) hypertension: Secondary | ICD-10-CM | POA: Insufficient documentation

## 2020-07-18 DIAGNOSIS — Z87891 Personal history of nicotine dependence: Secondary | ICD-10-CM | POA: Insufficient documentation

## 2020-07-18 DIAGNOSIS — Z20822 Contact with and (suspected) exposure to covid-19: Secondary | ICD-10-CM | POA: Diagnosis not present

## 2020-07-18 DIAGNOSIS — R531 Weakness: Secondary | ICD-10-CM | POA: Diagnosis present

## 2020-07-18 DIAGNOSIS — B9689 Other specified bacterial agents as the cause of diseases classified elsewhere: Secondary | ICD-10-CM | POA: Diagnosis not present

## 2020-07-18 DIAGNOSIS — R159 Full incontinence of feces: Secondary | ICD-10-CM | POA: Diagnosis not present

## 2020-07-18 DIAGNOSIS — G9520 Unspecified cord compression: Secondary | ICD-10-CM | POA: Insufficient documentation

## 2020-07-18 DIAGNOSIS — R296 Repeated falls: Secondary | ICD-10-CM | POA: Insufficient documentation

## 2020-07-18 DIAGNOSIS — Z79899 Other long term (current) drug therapy: Secondary | ICD-10-CM | POA: Diagnosis not present

## 2020-07-18 LAB — COMPREHENSIVE METABOLIC PANEL
ALT: 7 U/L (ref 0–44)
AST: 20 U/L (ref 15–41)
Albumin: 3.9 g/dL (ref 3.5–5.0)
Alkaline Phosphatase: 88 U/L (ref 38–126)
Anion gap: 11 (ref 5–15)
BUN: 14 mg/dL (ref 8–23)
CO2: 24 mmol/L (ref 22–32)
Calcium: 9.2 mg/dL (ref 8.9–10.3)
Chloride: 105 mmol/L (ref 98–111)
Creatinine, Ser: 0.75 mg/dL (ref 0.44–1.00)
GFR, Estimated: >60 mL/min (ref >60)
Glucose, Bld: 102 mg/dL — ABNORMAL HIGH (ref 70–99)
Potassium: 4 mmol/L (ref 3.5–5.1)
Sodium: 140 mmol/L (ref 135–145)
Total Bilirubin: 0.6 mg/dL (ref 0.3–1.2)
Total Protein: 7.6 g/dL (ref 6.5–8.1)

## 2020-07-18 LAB — CBC WITH DIFFERENTIAL/PLATELET
Abs Immature Granulocytes: 0.03 10*3/uL (ref 0.00–0.07)
Basophils Absolute: 0 10*3/uL (ref 0.0–0.1)
Basophils Relative: 0 %
Eosinophils Absolute: 0.1 10*3/uL (ref 0.0–0.5)
Eosinophils Relative: 2 %
HCT: 37.6 % (ref 36.0–46.0)
Hemoglobin: 12.1 g/dL (ref 12.0–15.0)
Immature Granulocytes: 0 %
Lymphocytes Relative: 25 %
Lymphs Abs: 1.7 10*3/uL (ref 0.7–4.0)
MCH: 29.9 pg (ref 26.0–34.0)
MCHC: 32.2 g/dL (ref 30.0–36.0)
MCV: 92.8 fL (ref 80.0–100.0)
Monocytes Absolute: 0.4 10*3/uL (ref 0.1–1.0)
Monocytes Relative: 6 %
Neutro Abs: 4.4 10*3/uL (ref 1.7–7.7)
Neutrophils Relative %: 67 %
Platelets: 259 10*3/uL (ref 150–400)
RBC: 4.05 MIL/uL (ref 3.87–5.11)
RDW: 13.2 % (ref 11.5–15.5)
WBC: 6.7 10*3/uL (ref 4.0–10.5)
nRBC: 0 % (ref 0.0–0.2)

## 2020-07-18 NOTE — ED Notes (Signed)
Spoke with dr. Archie Balboa regarding pt's symptoms. Order for blood work received, no mri/ct ordered at this time.

## 2020-07-18 NOTE — ED Triage Notes (Signed)
Pt from home with frequent falls and bilateral lower leg weakness. Pt states has had leg weakness for several months, but worsening over last three weeks. Pt with frequent falls at home today, pt states today she feels like "is the worst it has ever been". Pt has also been having urinary incontinence for the last 2-3 months.

## 2020-07-19 ENCOUNTER — Emergency Department: Payer: Medicare PPO

## 2020-07-19 ENCOUNTER — Emergency Department
Admission: EM | Admit: 2020-07-19 | Discharge: 2020-07-19 | Disposition: A | Discharge status: Other Acute Inpatient Hospital | Payer: Medicare PPO | Attending: Emergency Medicine | Admitting: Emergency Medicine

## 2020-07-19 DIAGNOSIS — R296 Repeated falls: Secondary | ICD-10-CM

## 2020-07-19 DIAGNOSIS — N390 Urinary tract infection, site not specified: Secondary | ICD-10-CM

## 2020-07-19 DIAGNOSIS — G9520 Unspecified cord compression: Secondary | ICD-10-CM

## 2020-07-19 DIAGNOSIS — R159 Full incontinence of feces: Secondary | ICD-10-CM

## 2020-07-19 LAB — URINALYSIS, COMPLETE (UACMP) WITH MICROSCOPIC
Bilirubin Urine: NEGATIVE
Glucose, UA: NEGATIVE mg/dL
Ketones, ur: NEGATIVE mg/dL
Nitrite: POSITIVE — AB
Protein, ur: NEGATIVE mg/dL
Specific Gravity, Urine: 1.016 (ref 1.005–1.030)
pH: 6 (ref 5.0–8.0)

## 2020-07-19 LAB — RESP PANEL BY RT-PCR (FLU A&B, COVID) ARPGX2
Influenza A by PCR: NEGATIVE
Influenza B by PCR: NEGATIVE
SARS Coronavirus 2 by RT PCR: NEGATIVE

## 2020-07-19 MED ORDER — SODIUM CHLORIDE 0.9 % IV BOLUS (SEPSIS): 1000.0000 mL | Freq: Once | INTRAVENOUS | Status: DC

## 2020-07-19 MED ORDER — SODIUM CHLORIDE 0.9 % IV SOLN: INTRAVENOUS | Status: DC

## 2020-07-19 MED ORDER — LORAZEPAM 2 MG/ML IJ SOLN
1.0000 mg | Freq: Once | INTRAMUSCULAR | Status: AC
  Administered 2020-07-19: 1 mg via INTRAVENOUS
  Filled 2020-07-19: qty 1

## 2020-07-19 MED ORDER — DEXAMETHASONE SODIUM PHOSPHATE 10 MG/ML IJ SOLN
10.0000 mg | Freq: Once | INTRAMUSCULAR | Status: AC
  Administered 2020-07-19: 10 mg via INTRAVENOUS
  Filled 2020-07-19: qty 1

## 2020-07-19 MED ORDER — MORPHINE SULFATE (PF) 4 MG/ML IV SOLN
4.0000 mg | Freq: Once | INTRAVENOUS | Status: AC
  Administered 2020-07-19: 4 mg via INTRAVENOUS
  Filled 2020-07-19: qty 1

## 2020-07-19 MED ORDER — SODIUM CHLORIDE 0.9 % IV SOLN
1.0000 g | Freq: Once | INTRAVENOUS | Status: AC
  Administered 2020-07-19: 1 g via INTRAVENOUS
  Filled 2020-07-19: qty 10

## 2020-07-19 MED ORDER — GADOBUTROL 1 MMOL/ML IV SOLN
9.0000 mL | Freq: Once | INTRAVENOUS | Status: AC | PRN
  Administered 2020-07-19: 10 mL via INTRAVENOUS

## 2020-07-19 NOTE — ED Notes (Signed)
Post void residual scan complete, average of 50-41mL present in bladder after voiding MD made aware of same

## 2020-07-19 NOTE — ED Notes (Signed)
[facility] to transport after shift change. 8AM

## 2020-07-19 NOTE — ED Notes (Addendum)
Patient to MRI.

## 2020-07-19 NOTE — ED Provider Notes (Addendum)
[facility] Emergency Department Provider Note  ____________________________________________   Event Date/Time   First MD Initiated Contact with Patient 07/19/20 0123     (approximate)  I have reviewed the triage vital signs and the nursing notes.   HISTORY  Chief Complaint Weakness    HPI Victoria Crane is a 70 y.o. female with history of previous breast and colon cancer, multiple previous lumbar spine surgeries who presents to the emergency department with worsening numbness, tingling, weakness in her bilateral lower extremities causing multiple falls today.  States she has chronic numbness in her toes and intermittent saddle anesthesia that has worsened over the past few weeks.  She also reports urinary incontinence for the past 3 to 4 weeks.  Started having bowel incontinence today but states that she is having diarrhea.  States that she has had weakness in her legs for 6 months that is progressively worsening.  She ambulates with a walker at home.  Denies any head injury tonight.  No loss of consciousness.  Not on blood thinners.  She states symptoms seem to worsen tonight around 5 PM.  States she fell in the bathroom initially.  States after her son left she then had a fall trying to get out of bed and could not get up and EMS had to come help get her off the floor.  It appears multiple of her previous spinal surgeries were done by Dr. Owens Shark.  She was then seen by Dr. Marcos Eke for a second opinion and was referred to Dr. Modena Morrow.      Per admit note at [facility] 06/28/20: "Victoria Crane is a 70 y.o. female with hx of colon cancer, breast cancer, arthritis and multiple spine surgeries with Dr. 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  Patient states that she underwent spinal surgery in  2010 requiring multiple washouts. Since the procedure, she has not had significant issues with back pain until 02/2019 where she has been experiencing weakness in the feet and required the use of rolling walker. She has had multiple falls as well. She followed with Dr. Marcos Eke who consulted Dr. Modena Morrow for complaints of worsening LBP and LLE pain and weakness with bilateral LE paresthesias R > L with difficulty with ambulation. She was seen in Dr. Teof[facility] clinic in 04/2019 with plans for an outpatient CT myelogram on 06/30/20.  In the past two weeks, she states that she has felt weaker in her LLE, has more urinary incontinence, and she cannot climb up the stairs in her house due to hip weakness on the left side.  Denies increased numbness, paresthesias and fecal incontinence, saddle anesthesia. Denies use of anticoagulants or antiplatelets in the past 7 days. MRI total spine shows chronic L5 compression deformity with retropulsion of bone causing severe spinal canal stenosis. Compression of cauda equina is stable from 2021. Unchanged spinal canal stenosis at T12-L1.  Victoria Crane Was evaluated at [facility] Emergency Department for cauda equina syndrome on 06/28/2020. Neurosurgery consulted. MRI total spine shows chronic L5 compression deformity with retropulsion of bone causing severe spinal canal stenosis. Compression of cauda equina is stable from 2021. Unchanged spinal canal stenosis at T12-L1. Although she has had subjective weakness and difficulties with ambulation, her exam is not notably different from her clinic visit on 3/29. She has intact rectal tone, no saddle anesthesia, and stable imaging with unchanged compression  of cauda equina and spinal canal stenosis. Considering her worsening weakness and inability to ambulate within her home, patient admitted for PTOT evaluation. It should be noted that patient tested positive for COVID while in the Emergency Department, therefore, patient was transferred to  [facility] unit where patient was closely monitored with serial neuro exams and vital sign checks and remained hemodynamically stable.  Urinalysis revealed urinary tract infection. Therefore, patient started on Ceftin for antibiotic management for a course of 5 days on 06/30/20. Discussed patient's care with Neuro IR for potential ESI therapy. Unfortunately, given COVID status, patient unable to start injections during her admission. Therefore, patient continued on previous home pain management and an outpatient appointment for [facility] was arranged.   Pharmacologic DVT prophylaxis with lovenox was started postoperatively and patient remained on therapy throughout hospital stay. Mechanical DVT prophylaxis with sequential compression devices was utilized while the patient was in bed. No signs or symptoms of deep vein thrombosis were noted.  She was evaluated by physical therapy and occupational therapy. After evaluation, they recommended patient be discharged to [place], however, patient eventually improved enough to clear for home with outpatient PT.   On 07/03/2020, Victoria Crane was afebrile with stable vital signs/exam and improvement in her UTI symptoms. Patient was deemed safe from a medical perspective to be discharged home and patient was in agreement with this plan.   she is being discharged with the following recommendations."  Past Medical History:  Diagnosis Date  . Anemia   . Arthritis   . Cancer ([place])    BREAST RIGHT-COLON CA  . Complication of anesthesia   . GERD (gastroesophageal reflux disease)   . Hypertension   . PONV (postoperative nausea and vomiting)    NAUSEA A LONG TIME AGO  . Thyroid goiter     Patient Active Problem List   Diagnosis Date Noted  . 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 ABDOMINAL HYSTERECTOMY    . BACK SURGERY     MULTIPLE BACK SURGERIES  . BREAST SURGERY    . COLON  SURGERY     DUE TO CANCER  . COLONOSCOPY    . HIP SURGERY Left   . JOINT REPLACEMENT Left    hip  . JOINT REPLACEMENT Bilateral    knees  . KNEE SURGERY Bilateral   . MASTECTOMY    . TOTAL HIP ARTHROPLASTY Right 09/17/2019   Procedure: TOTAL HIP ARTHROPLASTY ANTERIOR APPROACH;  Surgeon: Lovell Sheehan, MD;  Location: [facility] ORS;  Service: Orthopedics;  Laterality: Right;    Prior to Admission medications   Medication Sig Start Date End Date Taking? Authorizing Provider  ascorbic acid (VITAMIN C) 500 MG tablet Take 500 mg by mouth daily.   Yes [provider]  cetirizine (ZYRTEC) 10 MG tablet Take 10 mg by mouth daily.   Yes [provider]  diclofenac (VOLTAREN) 75 MG EC tablet Take 75 mg by mouth 2 (two) times daily. 05/03/20  Yes [provider]  dicyclomine (BENTYL) 10 MG capsule Take 10 mg by mouth 3 (three) times daily as needed for spasms.  05/15/16  Yes [provider]  ezetimibe (ZETIA) 10 MG tablet Take 10 mg by mouth at bedtime.   Yes [provider]  famotidine (PEPCID) 40 MG tablet Take 1 tablet by mouth at bedtime. 01/25/15  Yes [provider]  ferrous sulfate 325 (65 FE) MG tablet Take 325 mg by  mouth daily with breakfast.   Yes [provider]  FLUoxetine (PROZAC) 20 MG capsule Take 20-40 mg by mouth at bedtime.  06/13/13  Yes [provider]  fluticasone (FLONASE) 50 MCG/ACT nasal spray Place 1-2 sprays into both nostrils daily as needed for allergies.  09/27/09  Yes [provider]  gabapentin (NEURONTIN) 300 MG capsule Take 600 mg by mouth 2 (two) times daily.  05/21/13  Yes [provider]  hydrochlorothiazide (HYDRODIURIL) 25 MG tablet Take 12.5 mg by mouth every morning.  05/16/13  Yes [provider]  HYDROcodone-acetaminophen (NORCO/VICODIN) 5-325 MG tablet Take 1 tablet by mouth every 4 (four) hours as needed for moderate pain (pain score 4-6). Patient taking differently: Take  0.5 tablets by mouth in the morning and at bedtime. 09/19/19  Yes Carlynn Spry, PA-C  omeprazole (PRILOSEC) 20 MG capsule Take 20 mg by mouth daily.   Yes [provider]  ondansetron (ZOFRAN) 4 MG tablet Take 1 tablet (4 mg total) by mouth every 6 (six) hours as needed for nausea. 09/19/19  Yes Carlynn Spry, PA-C  oxybutynin (DITROPAN) 5 MG tablet Take 5 mg by mouth 2 (two) times daily. 07/03/20  Yes [provider]  potassium chloride SA (KLOR-CON) 20 MEQ tablet Take 20 mEq by mouth daily. 07/29/19  Yes [provider]    Allergies Augmentin [amoxicillin-pot clavulanate], Codeine, Darvon [propoxyphene], Lipitor [atorvastatin], Niacin and related, Reglan [metoclopramide], Zocor [simvastatin], Ciprofloxacin, Indocin [indomethacin], Iodinated diagnostic agents, and Sulfa antibiotics  No family history on file.  Social History Social History   Tobacco Use  . Smoking status: Former Smoker    Packs/day: 0.25    Years: 20.00    Pack years: 5.00    Types: Cigarettes    Quit date: 09/05/1994    Years since quitting: 25.8  . Smokeless tobacco: Never Used  . Tobacco comment: MORE SOCIAL SMOKING  Vaping Use  . Vaping Use: Never used  Substance Use Topics  . Alcohol use: Not Currently  . Drug use: Never    Review of Systems Constitutional: No fever. Eyes: No visual changes. ENT: No sore throat. Cardiovascular: Denies chest pain. Respiratory: Denies shortness of breath. Gastrointestinal: No nausea, vomiting, diarrhea. Genitourinary: Negative for dysuria. Musculoskeletal: Negative for back pain. Skin: Negative for rash. Neurological: Negative for focal weakness or numbness.  ____________________________________________   PHYSICAL EXAM:  VITAL SIGNS: ED Triage Vitals [07/18/20 2237]  Enc Vitals Group     BP 140/63     Pulse Rate 87     Resp 16     Temp 98.2 F (36.8 C)     Temp Source Oral     SpO2 97 %     Weight 215 lb (97.5 kg)     Height 5\' 1"   (1.549 m)     Head Circumference      Peak Flow      Pain Score 0     Pain Loc      Pain Edu?      Excl. in [place]?    CONSTITUTIONAL: Alert and oriented and responds appropriately to questions. Well-appearing; well-nourished HEAD: Normocephalic EYES: Conjunctivae clear, pupils appear equal, EOM appear intact ENT: normal nose; moist mucous membranes NECK: Supple, normal ROM CARD: RRR; S1 and S2 appreciated; no murmurs, no clicks, no rubs, no gallops RESP: Normal chest excursion without splinting or tachypnea; breath sounds clear and equal bilaterally; no wheezes, no rhonchi, no rales, no hypoxia or respiratory distress, speaking full sentences ABD/GI: Normal bowel  sounds; non-distended; soft, non-tender, no rebound, no guarding, no peritoneal signs, no hepatosplenomegaly RECTAL: Slightly diminished rectal tone, no gross blood or melena BACK: The back appears normal is tender to palpation diffusely over the lumbar spine with old surgical scars noted with no soft tissue swelling, redness or warmth EXT: Normal ROM in all joints; no deformity noted, no edema; no cyanosis, abrasion noted to the right forearm SKIN: Normal color for age and race; warm; no rash on exposed skin NEURO: Patient reports numbness in her bilateral feet as well as saddle anesthesia.  Normal sensation in her face and upper extremities.  Patient has +1 deep tendon reflexes in bilateral lower extremities.  No hyperreflexia or clonus.  She has very minimal ability to plantar or dorsiflex either foot.  She seems to have preserved strength however in hip and knee flexion and extension bilaterally. PSYCH: The patient's mood and manner are appropriate.  ____________________________________________   LABS (all labs ordered are listed, but only abnormal results are displayed)  Labs Reviewed  COMPREHENSIVE METABOLIC PANEL - Abnormal; Notable for the following components:      Result Value   Glucose, Bld 102 (*)    All other  components within normal limits  URINALYSIS, COMPLETE (UACMP) WITH MICROSCOPIC - Abnormal; Notable for the following components:   Color, Urine YELLOW (*)    APPearance CLOUDY (*)    Hgb urine dipstick SMALL (*)    Nitrite POSITIVE (*)    Leukocytes,Ua MODERATE (*)    Bacteria, UA RARE (*)    All other components within normal limits  RESP PANEL BY RT-PCR (FLU A&B, COVID) ARPGX2  URINE CULTURE  CBC WITH DIFFERENTIAL/PLATELET   ____________________________________________  EKG   ____________________________________________  RADIOLOGY I, Tabbetha Kutscher, personally viewed and evaluated these images (plain radiographs) as part of my medical decision making, as well as reviewing the written report by the radiologist.  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  Official radiology report(s): MR THORACIC SPINE W WO CONTRAST  Result Date: 07/19/2020 CLINICAL DATA:  70 year old female with progressive lower extremity weakness and new saddle anesthesia, incontinence following several falls. EXAM: MRI THORACIC WITHOUT AND WITH CONTRAST TECHNIQUE: Multiplanar and multiecho pulse sequences of the thoracic spine were obtained without and with intravenous contrast. CONTRAST:  38mL GADAVIST GADOBUTROL 1 MMOL/ML IV SOLN COMPARISON:  Chest radiographs 03/25/2015. Cervical spine MRI 03/01/2014. CT Abdomen and Pelvis 06/02/2020. [facility] report of [facility] total spine MRI last month (no images available). FINDINGS: Limited cervical spine imaging: Not significantly changed since 2016. Thoracic spine segmentation:  Appears to be normal. Alignment: Cervical lordosis does not appear significantly changed since 2017. There is subtle levoconvex thoracic scoliosis. There is mild retrolisthesis of T1 on T2. Vertebrae: Hardware susceptibility artifact beginning in the visible upper lumbar spine at L2. Pronounced chronic degenerative endplate marrow signal changes at T12-L1. See additional  details of that level below. Faint degenerative appearing marrow edema in right lateral L1 body. And multilevel chronic thoracic degenerative endplate marrow signal changes elsewhere. But no thoracic marrow edema or evidence of acute osseous abnormality. Background bone marrow signal within normal limits. Cord: Thoracic spinal cord is within normal limits above the T12 level. But the conus medullaris at T12-L1 is abnormal (series 19, image 9 and series 22, images 37 and 38). With apparent enlargement of the conus, abnormal loss of morphology, and increased T2 signal there. Following contrast abnormal intraspinal enhancement there appears either related to dural thickening or compressive myelopathy ( series 54,  image 9 and series 53, image 38. Paraspinal and other soft tissues: Negative visible chest and upper abdominal viscera; small benign renal cysts. Thoracic paraspinal soft tissues are normal above T12. Beginning at T12 and into the upper lumbar spine there are postoperative changes to the paraspinal soft tissues. See lumbar spine reported separately today. Disc levels: Widespread thoracic disc bulging and endplate spurring, with intermittent superimposed posterior element ligament and facet hypertrophy (such as T7-T8 series 22, image 22 and series 19, image 10). But there is no significant thoracic spinal stenosis above T10-T11. T10-T11: Mild circumferential spinal stenosis with borderline to mild spinal cord mass effect (series 22, image 32) related to bulky circumferential disc osteophyte complex and posterior element hypertrophy. There is also severe left and moderate to severe right T10 neural foraminal stenosis at this level. T11-T12: Circumferential disc osteophyte complex and mild to moderate facet hypertrophy without significant spinal stenosis. There is moderate to severe left and mild to moderate right T11 foraminal stenosis. T12-L1: Severe interbody vacuum phenomena here, confirmed on CT Abdomen and  Pelvis 06/02/2020. Bulky and lobulated circumferential disc osteophyte complex is superimposed and eccentric to the left with severe facet and moderate to severe ligament flavum hypertrophy. Trace degenerative facet joint fluid. Severe bilateral T12 neural foraminal stenosis. And spinal stenosis with both mass effect and abnormal appearance of the conus best seen on series 22, image 38. IMPRESSION: 1. Advanced thoracic spine degeneration, but the symptomatic level appears to be T12-L1, where severe chronic disc and posterior element degeneration in conjunction with widespread chronic lumbar fusion results in spinal stenosis with mass effect on the conus. And furthermore, there is abnormal conus morphology in keeping with conus edema and compressive myelopathy. 2. The thoracic spinal cord above T12 is within normal limits, despite mild degenerative spinal stenosis at T10-T11. 3. Severe degenerative lower thoracic neural foraminal stenosis is also noted. This study was discussed by telephone with Dr. Pryor Curia on 07/19/2020 at 05:19 . Electronically Signed   By: Genevie Ann M.D.   On: 07/19/2020 05:30   MR Lumbar Spine W Wo Contrast  Result Date: 07/19/2020 CLINICAL DATA:  70 year old female with progressive lower extremity weakness and new saddle anesthesia, incontinence following several falls. EXAM: MRI LUMBAR SPINE WITHOUT AND WITH CONTRAST TECHNIQUE: Multiplanar and multiecho pulse sequences of the lumbar spine were obtained without and with intravenous contrast. CONTRAST:  4mL GADAVIST GADOBUTROL 1 MMOL/ML IV SOLN COMPARISON:  Thoracic spine MRI today reported separately. CT Abdomen and Pelvis 06/02/2020. Report of [facility] total spine MRI last month (via care everywhere, no images available). FINDINGS: Segmentation: Normal, concordant with the thoracic spine numbering today. Alignment:  Stable since the April CT Abdomen and Pelvis. Vertebrae: Hardware susceptibility artifact L2 through the sacrum. Faint  degenerative appearing endplate and vertebral marrow edema at T12-L1 - which is detailed on the thoracic study today separately. No convincing acute osseous abnormality in the lumbar spine or visible sacrum. Conus medullaris and cauda equina: Conus extends to the T12-L1 level and is abnormal as described on the thoracic MRI today separately. This is redemonstrated on series 47, image 5, and abnormal enhancement at the conus is redemonstrated on series 55, image 7. Below the conus the cauda equina nerve roots appear normal until the L5-S1 level, where severe spinal stenosis completely effacing the thecal sac was reported on the MRI last month. No other abnormal intradural enhancement identified. Paraspinal and other soft tissues: Posterior paraspinal postoperative changes. No postoperative fluid collection identified. Atrophied paraspinal muscles, especially  the left psoas. Negative visible abdominal viscera. Small benign appearing renal cysts. Mild to moderate urinary bladder distention is noted. Disc levels: Below T12-L1 the lumbar spine has a stable postoperative appearance when compared to the April CT Abdomen and Pelvis. Prior decompression and/or fusion from L1-L2 through L4-L5. Severe bony spinal stenosis at L5-S1 appears stable. IMPRESSION: 1. Symptomatic abnormality appears to be T12-L1, with abnormal conus medullaris as described on the Thoracic MRI today (please see that report). 2. Stable extensive postoperative changes in the lumbar spine when compared to an April CT Abdomen and Pelvis. No lumbar spinal stenosis, aside from chronic severe bony stenosis of the canal at L5-S1. Electronically Signed   By: Genevie Ann M.D.   On: 07/19/2020 05:36    ____________________________________________   PROCEDURES  Procedure(s) performed (including Critical Care):  Procedures  CRITICAL CARE Performed by: Cyril Mourning Evette Diclemente   Total critical care time: 65 minutes  Critical care time was exclusive of separately  billable procedures and treating other patients.  Critical care was necessary to treat or prevent imminent or life-threatening deterioration.  Critical care was time spent personally by me on the following activities: development of treatment plan with patient and/or surrogate as well as nursing, discussions with consultants, evaluation of patient's response to treatment, examination of patient, obtaining history from patient or surrogate, ordering and performing treatments and interventions, ordering and review of laboratory studies, ordering and review of radiographic studies, pulse oximetry and re-evaluation of patient's condition.  ____________________________________________   INITIAL IMPRESSION / ASSESSMENT AND PLAN / ED COURSE  As part of my medical decision making, I reviewed the following data within the [place] notes reviewed and incorporated, Labs reviewed , Old chart reviewed, Radiograph reviewed , A consult was requested and obtained from this/these consultant(s) Neurosurgery and Notes from prior ED visits         Patient here with concerns for cauda equina, spinal stenosis.  She was seen immediately after she was brought back to a treatment room.  Has had significant previous spinal surgeries and has had cauda equina seen on previous imaging that was stable.  Was just recently admitted to [facility] for similar symptoms in mid May.  States symptoms worsening today and now having falls and bowel incontinence.  No fevers and no leukocytosis but does have history of hardware infection, discitis and osteomyelitis.  Will obtain MRI of the T and L-spine with and without contrast.  These were ordered immediately after my history and physical with the patient.  Will obtain postvoid residual.  Patient will need admission given her significant weakness and inability to ambulate.  ED PROGRESS  3:35 AM  Pt stopped her MRI before complete due to back pain.  She was brought  back to the room before I was notified.  She agrees to try again once she has received pain medication.  I discussed with her that we are trying to rule out time sensitive neurosurgical emergencies and that we need a complete MRI of her lumbar spine given this is where she has had multiple previous surgeries before.  I have ordered pain medication for her and sent a note to both her nurse as well as the MRI technician.  5:15 AM  Pt back from MRI.  Post void residual shows approximately 50 to 70 mL.  Will contact radiologist regarding patient's imaging results.  5:20 AM  Spoke with Dr. [place] Crane with radiology.  Fused from L2 to sacrum.  He states T12- L1 is  unfused with significant degenerative change, conus medularis is located here and is abnormal with edema or myelomalacia concerning for acute compression.  Also severe stenosis at L5. He states there was no comment on recent imaging at [facility] regarding abnormal signal within her conus medullaris.  He suspects that this is her acute issue.  Will discuss with neurosurgery.  5:30 AM  Spoke with Dr. Izora Ribas with neurosurgery on call here at [facility] who states patient will need transport to [facility] for evaluation by neurosurgery there.  Discussed possibility of giving steroids to the patient.  He states it is not unreasonable.  Will give 10 mg of Decadron now.  We will keep patient n.p.o.  5:40 AM  Spoke with [facility] transfer center.  They will get in touch with spine surgery on-call.  Patient did have 1 blood pressure that was documented low of 84/72.  On my reevaluation it appears she has a blood pressure cuff on that is too large for her.  Immediately on recheck it is 120s/70s.  She denies any symptoms other than her back pain and neurologic deficits.  No dizziness.  6:05 AM  Pt's urine appears possibly infected with moderate leukocytes, 21-50 white blood cells, rare bacteria but also many squamous cells.  Could be a contaminated sample.  We will add on urine  culture and give Rocephin.  Previous urine cultures have grown E. coli sensitive to cephalosporins.  Spoke with Dr. Antionette Fairy at [facility] who agrees to accept patient in ED to ED transfer.  Will transport by emergency traffic given concerns for acute compressive myelopathy.  6:20 AM  Ground transport not available until 8 - 9 AM.  Discussed this with Dr. Cari Caraway.  Patient does not need to be flown to [facility] at this time.  He feels ground transport, nonemergent as appropriate.  I reviewed all nursing notes and pertinent previous records as available.  I have reviewed and interpreted any EKGs, lab and urine results, imaging (as available).  7:18 AM  [facility] ground transport on the way to pick up patient. ____________________________________________   FINAL CLINICAL IMPRESSION(S) / ED DIAGNOSES  Final diagnoses:  Multiple falls  Incontinence of feces, unspecified fecal incontinence type  Cord compression myelopathy ([place])  Acute UTI     ED Discharge Orders    None      *Please note:  Julina Crane was evaluated in Emergency Department on 07/19/2020 for the symptoms described in the history of present illness. She was evaluated in the context of the global COVID-19 pandemic, which necessitated consideration that the patient might be at risk for infection with the SARS-CoV-2 virus that causes COVID-19. Institutional protocols and algorithms that pertain to the evaluation of patients at risk for COVID-19 are in a state of rapid change based on information released by regulatory bodies including the CDC and federal and state organizations. These policies and algorithms were followed during the patient's care in the ED.  Some ED evaluations and interventions may be delayed as a result of limited staffing during and the pandemic.*   Note:  This document was prepared using Dragon voice recognition software and may include unintentional dictation errors.   Nickalas Mccarrick, Delice Bison, DO 07/19/20  [phone]    Carisma Troupe, Delice Bison, DO 07/19/20 [phone]

## 2020-07-19 NOTE — ED Notes (Signed)
Fenton Malling to [employer] to [employer]

## 2020-07-19 NOTE — ED Notes (Signed)
Patient returned from MRI.

## 2020-07-19 NOTE — ED Notes (Signed)
Pt accepted to [facility] to ED per Judson Roch, accepting provider Antionette Fairy, call report to [phone].

## 2020-07-19 NOTE — ED Notes (Signed)
Spoke with [facility]. Gave update on recent VS etc. They are 10-12 min away.

## 2020-07-19 NOTE — ED Notes (Signed)
D/w Dr. Leonides Schanz, no additional orders at this time for imaging while waiting to be seen.

## 2020-07-21 LAB — URINE CULTURE: Culture: >=100000 — AB

## 2021-04-26 ENCOUNTER — Other Ambulatory Visit: Payer: Self-pay

## 2021-04-26 ENCOUNTER — Emergency Department
Admission: EM | Admit: 2021-04-26 | Discharge: 2021-04-27 | Disposition: A | Discharge status: Home / Self Care | Payer: Medicare PPO | Attending: Emergency Medicine | Admitting: Emergency Medicine

## 2021-04-26 ENCOUNTER — Emergency Department: Payer: Medicare PPO

## 2021-04-26 DIAGNOSIS — N3001 Acute cystitis with hematuria: Secondary | ICD-10-CM | POA: Diagnosis not present

## 2021-04-26 DIAGNOSIS — Z853 Personal history of malignant neoplasm of breast: Secondary | ICD-10-CM | POA: Diagnosis not present

## 2021-04-26 DIAGNOSIS — Z96641 Presence of right artificial hip joint: Secondary | ICD-10-CM | POA: Diagnosis not present

## 2021-04-26 DIAGNOSIS — I10 Essential (primary) hypertension: Secondary | ICD-10-CM | POA: Diagnosis not present

## 2021-04-26 DIAGNOSIS — Z20822 Contact with and (suspected) exposure to covid-19: Secondary | ICD-10-CM | POA: Insufficient documentation

## 2021-04-26 DIAGNOSIS — R319 Hematuria, unspecified: Secondary | ICD-10-CM | POA: Diagnosis present

## 2021-04-26 DIAGNOSIS — Z85038 Personal history of other malignant neoplasm of large intestine: Secondary | ICD-10-CM | POA: Diagnosis not present

## 2021-04-26 LAB — URINALYSIS, COMPLETE (UACMP) WITH MICROSCOPIC
Bacteria, UA: NONE SEEN
Bilirubin Urine: NEGATIVE
Glucose, UA: NEGATIVE mg/dL
Ketones, ur: NEGATIVE mg/dL
Nitrite: NEGATIVE
Protein, ur: 100 mg/dL — AB
RBC / HPF: >50 RBC/hpf — ABNORMAL HIGH (ref 0–5)
Specific Gravity, Urine: 1.014 (ref 1.005–1.030)
Squamous Epithelial / HPF: NONE SEEN (ref 0–5)
WBC, UA: >50 WBC/hpf — ABNORMAL HIGH (ref 0–5)
pH: 8 (ref 5.0–8.0)

## 2021-04-26 LAB — COMPREHENSIVE METABOLIC PANEL
ALT: 13 U/L (ref 0–44)
AST: 20 U/L (ref 15–41)
Albumin: 3.1 g/dL — ABNORMAL LOW (ref 3.5–5.0)
Alkaline Phosphatase: 70 U/L (ref 38–126)
Anion gap: 5 (ref 5–15)
BUN: 28 mg/dL — ABNORMAL HIGH (ref 8–23)
CO2: 24 mmol/L (ref 22–32)
Calcium: 8.7 mg/dL — ABNORMAL LOW (ref 8.9–10.3)
Chloride: 106 mmol/L (ref 98–111)
Creatinine, Ser: 1.74 mg/dL — ABNORMAL HIGH (ref 0.44–1.00)
GFR, Estimated: 31 mL/min — ABNORMAL LOW (ref >60)
Glucose, Bld: 118 mg/dL — ABNORMAL HIGH (ref 70–99)
Potassium: 4.4 mmol/L (ref 3.5–5.1)
Sodium: 135 mmol/L (ref 135–145)
Total Bilirubin: 0.5 mg/dL (ref 0.3–1.2)
Total Protein: 7.6 g/dL (ref 6.5–8.1)

## 2021-04-26 LAB — CBC WITH DIFFERENTIAL/PLATELET
Abs Immature Granulocytes: 0.03 10*3/uL (ref 0.00–0.07)
Basophils Absolute: 0 10*3/uL (ref 0.0–0.1)
Basophils Relative: 0 %
Eosinophils Absolute: 0.1 10*3/uL (ref 0.0–0.5)
Eosinophils Relative: 1 %
HCT: 34.7 % — ABNORMAL LOW (ref 36.0–46.0)
Hemoglobin: 10.7 g/dL — ABNORMAL LOW (ref 12.0–15.0)
Immature Granulocytes: 0 %
Lymphocytes Relative: 17 %
Lymphs Abs: 1.5 10*3/uL (ref 0.7–4.0)
MCH: 28.8 pg (ref 26.0–34.0)
MCHC: 30.8 g/dL (ref 30.0–36.0)
MCV: 93.5 fL (ref 80.0–100.0)
Monocytes Absolute: 0.9 10*3/uL (ref 0.1–1.0)
Monocytes Relative: 10 %
Neutro Abs: 6.1 10*3/uL (ref 1.7–7.7)
Neutrophils Relative %: 72 %
Platelets: 269 10*3/uL (ref 150–400)
RBC: 3.71 MIL/uL — ABNORMAL LOW (ref 3.87–5.11)
RDW: 13.4 % (ref 11.5–15.5)
WBC: 8.5 10*3/uL (ref 4.0–10.5)
nRBC: 0 % (ref 0.0–0.2)

## 2021-04-26 LAB — RESP PANEL BY RT-PCR (FLU A&B, COVID) ARPGX2
Influenza A by PCR: NEGATIVE
Influenza B by PCR: NEGATIVE
SARS Coronavirus 2 by RT PCR: NEGATIVE

## 2021-04-26 MED ORDER — SODIUM CHLORIDE 0.9 % IV BOLUS
1000.0000 mL | Freq: Once | INTRAVENOUS | Status: AC
  Administered 2021-04-26: 1000 mL via INTRAVENOUS

## 2021-04-26 MED ORDER — CEFDINIR 300 MG PO CAPS
300.0000 mg | ORAL_CAPSULE | Freq: Two times a day (BID) | ORAL | Status: DC
  Administered 2021-04-27: 300 mg via ORAL
  Filled 2021-04-26 (×2): qty 1

## 2021-04-26 MED ORDER — CEFDINIR 300 MG PO CAPS: 300.0000 mg | ORAL_CAPSULE | Freq: Two times a day (BID) | ORAL | 0 refills | Status: AC

## 2021-04-26 NOTE — Discharge Instructions (Addendum)
You likely have a urinary tract infection, please take the antibiotic for the next 5 days. Please follow-up with your Urologist.  ?

## 2021-04-26 NOTE — ED Provider Notes (Signed)
? ?[facility] ?Provider Note ? ? ? Event Date/Time  ? First MD Initiated Contact with Patient 04/26/21 2118   ?  (approximate) ? ? ?History  ? ?Hematuria ? ? ?HPI ? ?Victoria Crane is a 71 y.o. female   with past medical history of prior breast cancer and colon cancer, multiple prior lumbar spine surgeries, frequent UTIs who presents with hematuria and fever.  Patient notes that she recently completed a course of doxycycline about 5 days ago.  She was on it for about 1 week.  This was for UTI.  She then developed blood in her urine.  Initially it was very bloody and now is just about pink.  Over the last 2 nights she has had a fever at night which has been subjective, has had associated sweats with this.  This is what prompted her to come to the ED.  She has been treated multiple times for UTIs before.  Does have a scheduled cystoscopy with urology.  Denies cough congestion shortness of breath chest pain.  Patient also with chronic loose stools for several months which her gastroenterologist is aware of.  She endorses some bilateral lower abdominal cramping which is overall stable over the last several months. ? ?  ? ?Past Medical History:  ?Diagnosis Date  ? Anemia   ? Arthritis   ? Cancer [facility])   ? BREAST RIGHT-COLON CA  ? Complication of anesthesia   ? GERD (gastroesophageal reflux disease)   ? Hypertension   ? PONV (postoperative nausea and vomiting)   ? NAUSEA A LONG TIME AGO  ? Thyroid goiter   ? ? ?Patient Active Problem List  ? Diagnosis Date Noted  ? 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 ? ? ?Physical Exam  ?Triage Vital Signs: ?ED Triage Vitals  ?Enc Vitals Group  ?   BP 04/26/21 2058 (!) 109/49  ?   Pulse Rate 04/26/21 2058 86  ?   Resp 04/26/21 2058 18  ?   Temp 04/26/21 2058 98.1 ?F (36.7 ?C)  ?   Temp Source 04/26/21 2058 Oral  ?   SpO2 04/26/21 2058 99 %  ?   Weight 04/26/21 2059 185 lb (83.9 kg)  ?   Height 04/26/21 2059 '5\' 1"'$  (1.549 m)  ?   Head  Circumference --   ?   Peak Flow --   ?   Pain Score 04/26/21 2059 5  ?   Pain Loc --   ?   Pain Edu? --   ?   Excl. in [place]? --   ? ? ?Most recent vital signs: ?Vitals:  ? 04/26/21 2300 04/26/21 2335  ?BP: (!) 93/52 (!) 118/58  ?Pulse: 74 (!) 2  ?Resp: 17   ?Temp:    ?SpO2: 98%   ? ? ? ?General: Awake, no distress.  ?CV:  Good peripheral perfusion.  ?Resp:  Normal effort.  ?Abd:  No distention.  Soft and nontender throughout ?Neuro:             Awake, Alert, Oriented x 3  ?Other:   ? ? ?ED Results / Procedures / Treatments  ?Labs ?(all labs ordered are listed, but only abnormal results are displayed) ?Labs Reviewed  ?URINALYSIS, COMPLETE (UACMP) WITH MICROSCOPIC - Abnormal; Notable for the following components:  ?    Result Value  ? Color, Urine YELLOW (*)   ? APPearance TURBID (*)   ? Hgb urine dipstick MODERATE (*)   ? Protein,  ur 100 (*)   ? Leukocytes,Ua MODERATE (*)   ? RBC / HPF >50 (*)   ? WBC, UA >50 (*)   ? All other components within normal limits  ?CBC WITH DIFFERENTIAL/PLATELET - Abnormal; Notable for the following components:  ? RBC 3.71 (*)   ? Hemoglobin 10.7 (*)   ? HCT 34.7 (*)   ? All other components within normal limits  ?COMPREHENSIVE METABOLIC PANEL - Abnormal; Notable for the following components:  ? Glucose, Bld 118 (*)   ? BUN 28 (*)   ? Creatinine, Ser 1.74 (*)   ? Calcium 8.7 (*)   ? Albumin 3.1 (*)   ? GFR, Estimated 31 (*)   ? All other components within normal limits  ?RESP PANEL BY RT-PCR (FLU A&B, COVID) [id number]  ?URINE CULTURE  ? ? ? ?EKG ? ? ? ? ?RADIOLOGY ?Reviewed the patient's CAT scan which is negative for obstructing stone or other acute process ? ? ?PROCEDURES: ? ?Critical Care performed: No ? ?.1-3 Lead EKG Interpretation ?Performed by: Rada Hay, MD ?Authorized by: Rada Hay, MD  ? ?  Interpretation: normal   ?  ECG rate assessment: normal   ?  Ectopy: none   ?  Conduction: normal   ? ?The patient is on the cardiac monitor to evaluate for evidence of  arrhythmia and/or significant heart rate changes. ? ? ?MEDICATIONS ORDERED IN ED: ?Medications  ?cefdinir (OMNICEF) capsule 300 mg (has no administration in time range)  ?sodium chloride 0.9 % bolus 1,000 mL (1,000 mLs Intravenous New Bag/Given 04/26/21 2231)  ? ? ? ?IMPRESSION / MDM / ASSESSMENT AND PLAN / ED COURSE  ?I reviewed the triage vital signs and the nursing notes. ?             ?               ? ?Differential diagnosis includes, but is not limited to, UTI, kidney stone, hemorrhagic cystitis, bladder stone, bladder cancer ? ?Is a 71 year old female who presents with hematuria and a fever at home.  Patient's been treated for multiple UTIs with associated hematuria over the last several months.  She is following with an oncologist who has been managing this.  She recently completed a course of doxycycline for Klebsiella UTI, antibiotics finished about 5 days ago.  After that time she developed hematuria and has had ongoing urgency and frequency but no dysuria.  Symptoms never really changed after the doxycycline.  She had subjective fever overnight for the last 2 nights which is really why she comes today.  Of note patient has been having chronic diarrhea over the last 2 months which is being worked up by GI.  She feels like she is constantly having some leakage of stool and that this is likely contributing to the UTIs.  Patient is afebrile here blood pressure is low end of normal.  Patient appears quite well her abdomen is soft and nontender.  No leukocytosis, hemoglobin 10.7, baseline around 11.1.  Had a mild AKI with a creatinine of 1.7 from baseline of around 1.  Cath UA today shows greater than 50 RBCs and greater than 50 WBCs with moderate leuks nitrate negative consistent with likely UTI, though if still Klebsiella would expect there to be nitrites +.  CT today does not show any kidney stone or other acute process.  Will treat with a course of cefdinir.  Patient's oncologist is planning to follow-up  the urine culture to determine  need for suppressive therapy. ? ?  ? ? ?FINAL CLINICAL IMPRESSION(S) / ED DIAGNOSES  ? ?Final diagnoses:  ?Acute cystitis with hematuria  ? ? ? ?Rx / DC Orders  ? ?ED Discharge Orders   ? ?      Ordered  ?  cefdinir (OMNICEF) 300 MG capsule  2 times daily       ? 04/26/21 2338  ? ?  ?  ? ?  ? ? ? ?Note:  This document was prepared using Dragon voice recognition software and may include unintentional dictation errors. ?  ?Rada Hay, MD ?04/26/21 2341 ? ?

## 2021-04-26 NOTE — ED Triage Notes (Signed)
Pt presents via POV with complaints of hematuria starting 4 days ago with intermittent fevers and bilateral lower abdominal pain. She states that she completed a course of antibiotics last Tuesday for a UTI. She notes having frequent UTIs due to being incontinent of stool. Denies CP or SOB. ?

## 2021-04-29 LAB — URINE CULTURE: Culture: >=100000 — AB

## 2021-05-12 ENCOUNTER — Encounter: Payer: Self-pay | Admitting: Pediatrics

## 2021-05-16 ENCOUNTER — Telehealth: Payer: Self-pay

## 2021-05-16 NOTE — Telephone Encounter (Signed)
Scheduled in person visit ?

## 2021-05-30 IMAGING — RF DG HIP (WITH PELVIS) OPERATIVE*R*
1 series · 4 of 4 positions shown · non-contrast
Comparison: None.

CLINICAL DATA: Elective right hip surgery.

EXAM:
OPERATIVE right HIP (WITH PELVIS IF PERFORMED) 3 VIEWS
TECHNIQUE: Fluoroscopic spot image(s) were submitted for interpretation
post-operatively.
FLUOROSCOPY TIME:  12 seconds.

[Series 1: run · 4 of 4 slices shown]
[im 1/4]
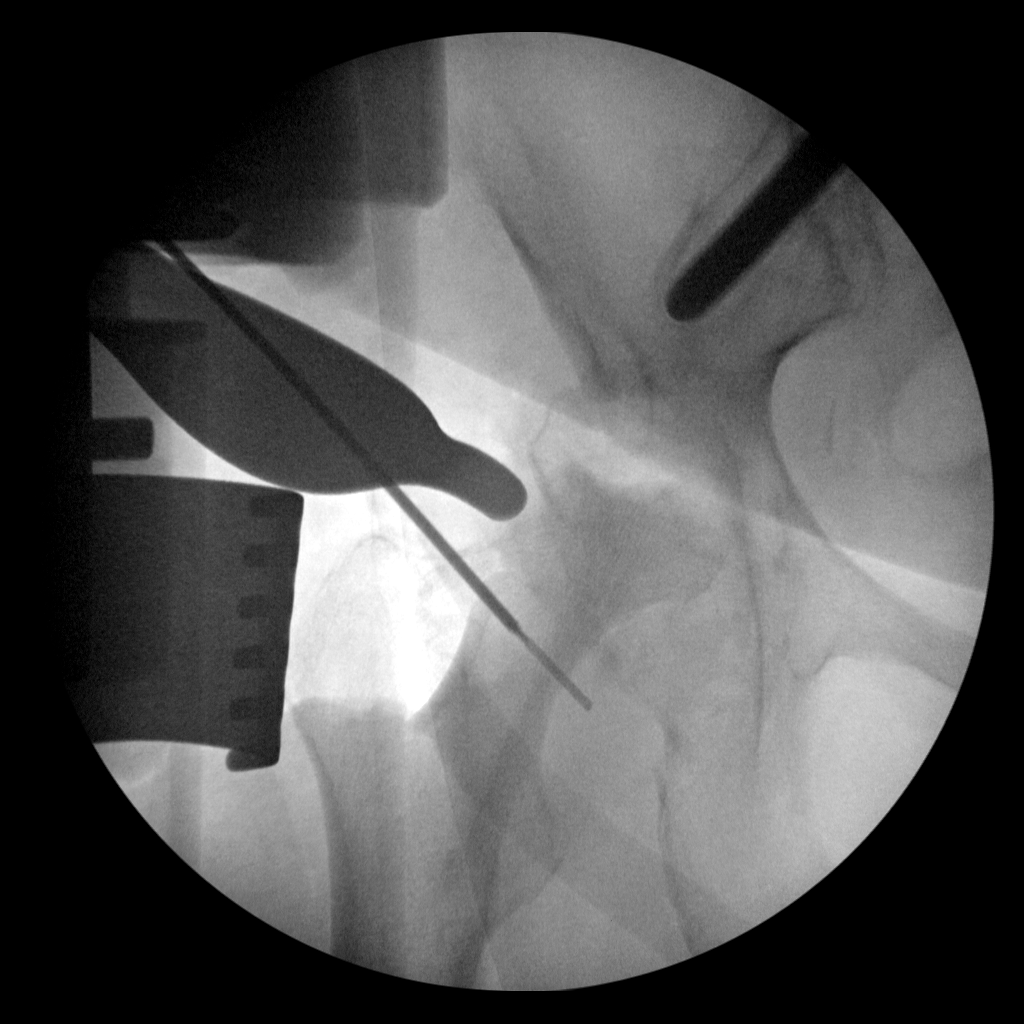
[im 2/4]
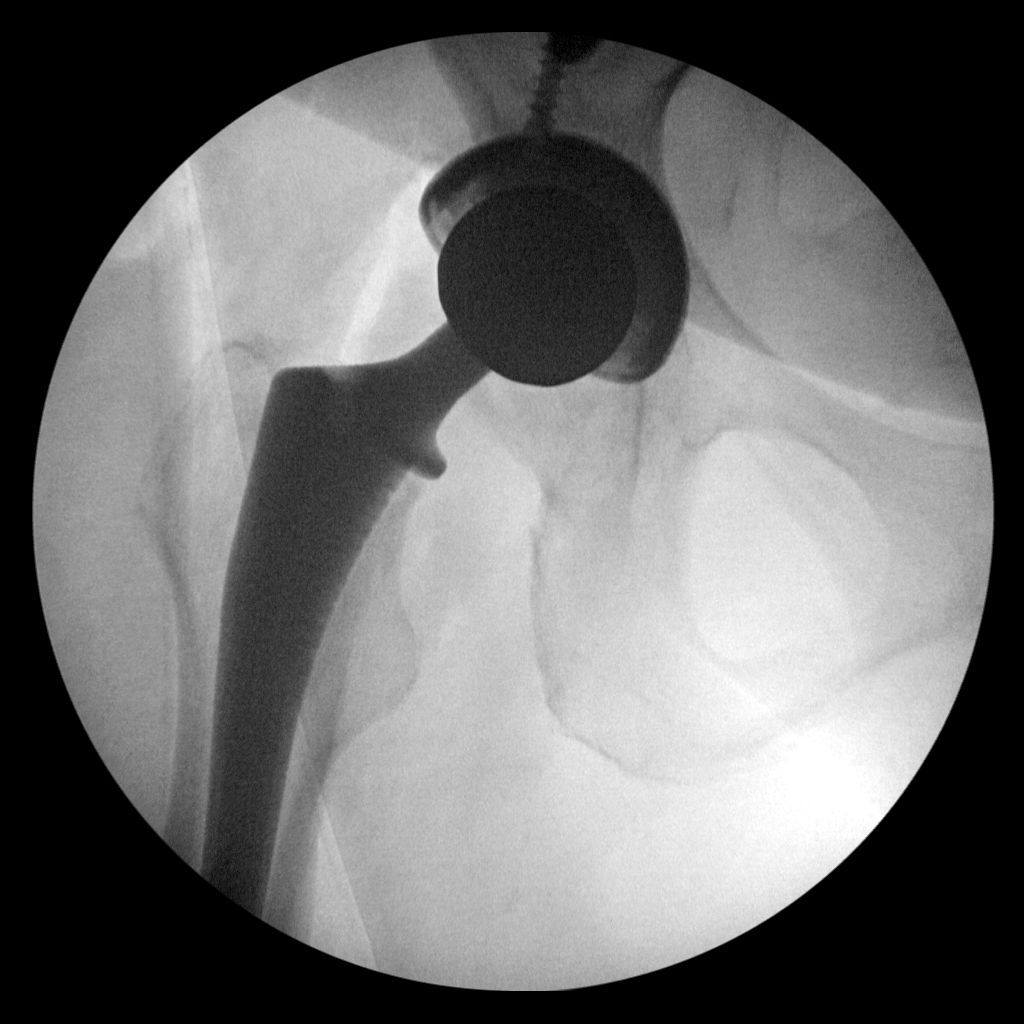
[im 3/4]
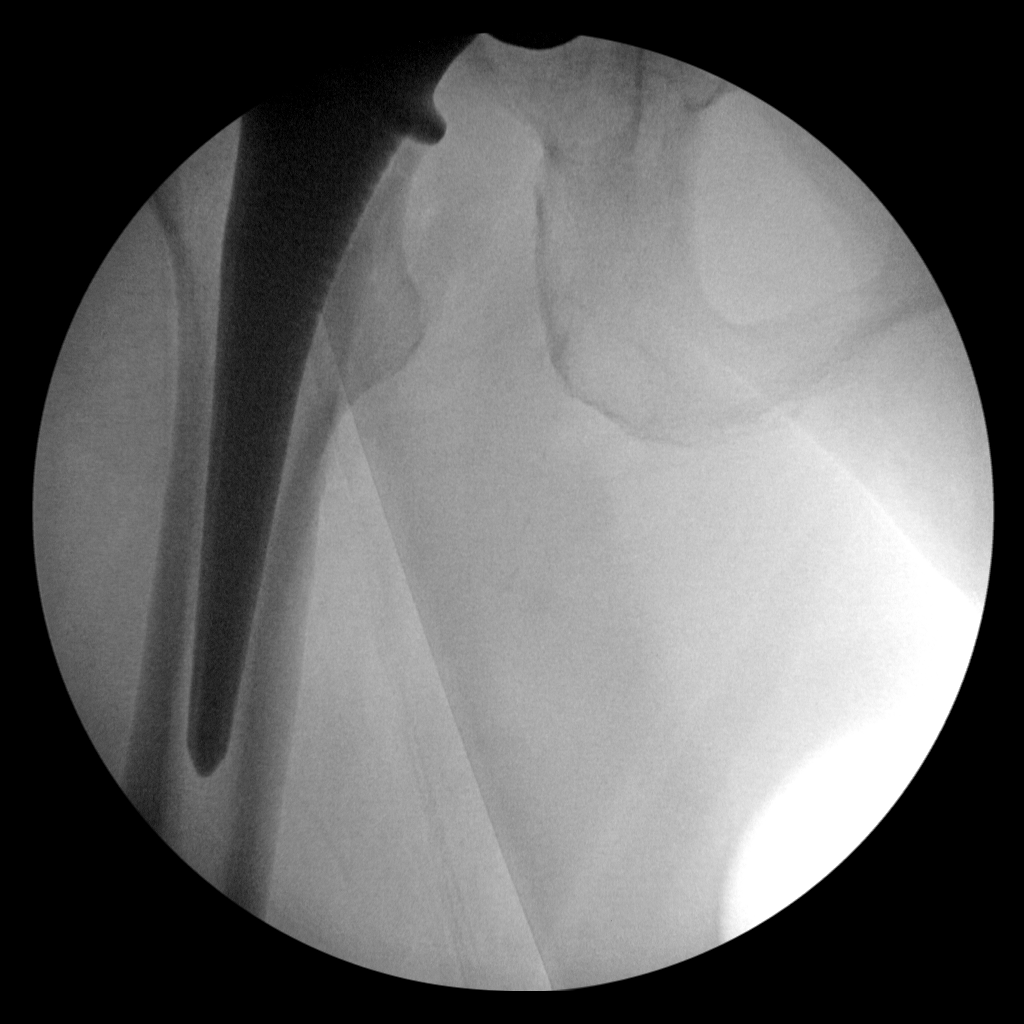
[im 4/4]
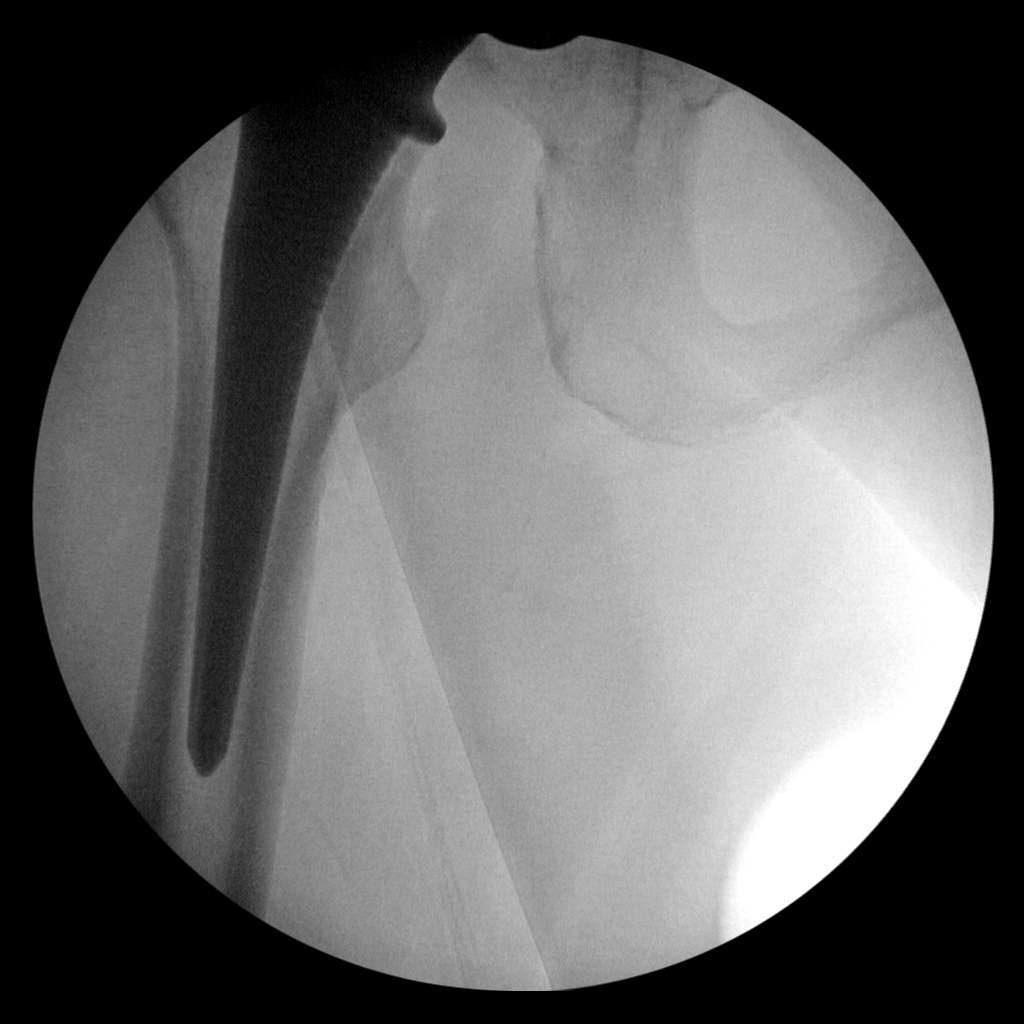

[4 of 4 positions shown; findings below may reference images not displayed]

FINDINGS: Three intraoperative fluoroscopic images were obtained of the right
hip. The right femoral and acetabular components appear to be well
situated.
IMPRESSION: Fluoroscopic guidance provided during right total hip arthroplasty.

## 2021-09-05 ENCOUNTER — Ambulatory Visit: Payer: Medicare PPO | Admitting: Gastroenterology

## 2021-09-28 ENCOUNTER — Emergency Department
Admission: EM | Admit: 2021-09-28 | Discharge: 2021-09-29 | Disposition: A | Discharge status: Home / Self Care | Payer: Medicare PPO | Attending: Emergency Medicine | Admitting: Emergency Medicine

## 2021-09-28 ENCOUNTER — Other Ambulatory Visit: Payer: Self-pay

## 2021-09-28 ENCOUNTER — Encounter: Payer: Self-pay | Admitting: Emergency Medicine

## 2021-09-28 DIAGNOSIS — N3001 Acute cystitis with hematuria: Secondary | ICD-10-CM | POA: Diagnosis not present

## 2021-09-28 DIAGNOSIS — R509 Fever, unspecified: Secondary | ICD-10-CM | POA: Diagnosis present

## 2021-09-28 LAB — BASIC METABOLIC PANEL
Anion gap: 11 (ref 5–15)
BUN: 42 mg/dL — ABNORMAL HIGH (ref 8–23)
CO2: 22 mmol/L (ref 22–32)
Calcium: 9 mg/dL (ref 8.9–10.3)
Chloride: 108 mmol/L (ref 98–111)
Creatinine, Ser: 1.95 mg/dL — ABNORMAL HIGH (ref 0.44–1.00)
GFR, Estimated: 27 mL/min — ABNORMAL LOW (ref >60)
Glucose, Bld: 112 mg/dL — ABNORMAL HIGH (ref 70–99)
Potassium: 4.6 mmol/L (ref 3.5–5.1)
Sodium: 141 mmol/L (ref 135–145)

## 2021-09-28 LAB — CBC
HCT: 30.6 % — ABNORMAL LOW (ref 36.0–46.0)
Hemoglobin: 9.4 g/dL — ABNORMAL LOW (ref 12.0–15.0)
MCH: 28.7 pg (ref 26.0–34.0)
MCHC: 30.7 g/dL (ref 30.0–36.0)
MCV: 93.6 fL (ref 80.0–100.0)
Platelets: 344 10*3/uL (ref 150–400)
RBC: 3.27 MIL/uL — ABNORMAL LOW (ref 3.87–5.11)
RDW: 13.5 % (ref 11.5–15.5)
WBC: 9 10*3/uL (ref 4.0–10.5)
nRBC: 0 % (ref 0.0–0.2)

## 2021-09-28 MED ORDER — SODIUM CHLORIDE 0.9 % IV BOLUS
1000.0000 mL | Freq: Once | INTRAVENOUS | Status: AC
  Administered 2021-09-29: 1000 mL via INTRAVENOUS

## 2021-09-28 MED ORDER — SODIUM CHLORIDE 0.9 % IV SOLN
2.0000 g | Freq: Once | INTRAVENOUS | Status: AC
  Administered 2021-09-29: 2 g via INTRAVENOUS
  Filled 2021-09-28: qty 20

## 2021-09-28 NOTE — ED Triage Notes (Signed)
Patient sent to ED by PCP for possible AKI and needing IV antibiotics. Patient had intermittent fevers x4 days. Patient incontinent of urine and has frequent UTI's.

## 2021-09-28 NOTE — ED Provider Notes (Signed)
[facility] Provider Note    Event Date/Time   First MD Initiated Contact with Patient 09/28/21 2337     (approximate)   History   Fever   HPI  Victoria Crane is a 71 y.o. female who presents to the ED for evaluation of Fever   I reviewed PCP visit from yesterday, a little over 24 hours ago, where patient was evaluated in the clinic for subacute fever over the past 2 weeks with a temperature up to 100.0 F.  Associated sinus congestion.  Outpatient labs and respiratory swabs were ordered.  I reviewed the urinalysis with positive nitrites and large leukocytes concerning for the possibility of cystitis.  Creatinine of 2.1, WBC of 11.1.  Negative antigen influenza.  I reviewed outpatient routine blood work from 7/12 with a creatinine of 1.7. History of overactive bladder and stress incontinence with July operative bladder stimulator insertion.  Patient presents to the ED for evaluation of these abnormal outpatient labs, at the direction of her PCP.  She reports that she was feeling daily fevers, 2 times per day every day, these have resolved in the past 2 days. Reports feeling "yucky" and generalized fashion superimposed on chronic "bladder spasms" suprapubic discomfort.   Last course of antibiotics for greater than 2 weeks ago.    Physical Exam   Triage Vital Signs: ED Triage Vitals [09/28/21 1756]  Enc Vitals Group     BP 106/62     Pulse Rate 80     Resp 18     Temp 98.8 F (37.1 C)     Temp Source Oral     SpO2 98 %     Weight 172 lb (78 kg)     Height '5\' 1"'$  (1.549 m)     Head Circumference      Peak Flow      Pain Score 5     Pain Loc      Pain Edu?      Excl. in [place]?     Most recent vital signs: Vitals:   09/29/21 0130 09/29/21 0251  BP: 133/78 (!) 146/88  Pulse: 73 74  Resp: 18 18  Temp:    SpO2: 99% 99%    General: Awake, no distress.  Pleasant and conversational.  Looks systemically well. CV:  Good peripheral  perfusion.  Resp:  Normal effort.  Abd:  No distention.  Benign throughout.  No CVA tenderness appreciated. MSK:  No deformity noted.  Neuro:  No focal deficits appreciated. Other:     ED Results / Procedures / Treatments   Labs (all labs ordered are listed, but only abnormal results are displayed) Labs Reviewed  URINALYSIS, ROUTINE W REFLEX MICROSCOPIC - Abnormal; Notable for the following components:      Result Value   Color, Urine YELLOW (*)    APPearance TURBID (*)    Hgb urine dipstick LARGE (*)    Protein, ur 30 (*)    Leukocytes,Ua LARGE (*)    RBC / HPF >50 (*)    WBC, UA >50 (*)    All other components within normal limits  BASIC METABOLIC PANEL - Abnormal; Notable for the following components:   Glucose, Bld 112 (*)    BUN 42 (*)    Creatinine, Ser 1.95 (*)    GFR, Estimated 27 (*)    All other components within normal limits  CBC - Abnormal; Notable for the following components:   RBC 3.27 (*)    Hemoglobin  9.4 (*)    HCT 30.6 (*)    All other components within normal limits  URINE CULTURE    EKG   RADIOLOGY CT renal study interpreted by me without evidence of ureteral obstruction or pyelo  Official radiology report(s): CT Renal Stone Study  Result Date: 09/29/2021 CLINICAL DATA:  Intermittent fever. EXAM: CT ABDOMEN AND PELVIS WITHOUT CONTRAST TECHNIQUE: Multidetector CT imaging of the abdomen and pelvis was performed following the standard protocol without IV contrast. RADIATION DOSE REDUCTION: This exam was performed according to the departmental dose-optimization program which includes automated exposure control, adjustment of the mA and/or kV according to patient size and/or use of iterative reconstruction technique. COMPARISON:  April 26, 2021 FINDINGS: Lower chest: A right breast implant is noted. Hepatobiliary: No focal liver abnormality is seen. A solitary subcentimeter gallstone is seen within the lumen of an otherwise normal-appearing gallbladder.  There is no evidence of biliary dilatation. Pancreas: Unremarkable. No pancreatic ductal dilatation or surrounding inflammatory changes. Spleen: Normal in size without focal abnormality. Adrenals/Urinary Tract: Adrenal glands are unremarkable. Kidneys are normal in size. A stable 1.1 cm diameter cyst is seen within the posterior aspect of the mid right kidney. Moderate severity bilateral hydronephrosis and hydroureter are seen. Renal calculi are not identified. The urinary bladder is markedly distended and limited in evaluation secondary to the presence of overlying streak artifact. Stomach/Bowel: There is a small hiatal hernia. The appendix is not clearly identified. Surgically anastomosed bowel is seen within the mid to lower right abdomen. A large amount of stool is seen throughout the colon. No evidence of bowel wall thickening, distention, or inflammatory changes. Vascular/Lymphatic: Aortic atherosclerosis. No enlarged abdominal or pelvic lymph nodes. Reproductive: The uterus is not clearly identified. The bilateral adnexa are limited in evaluation secondary to the presence of overlying streak artifact. Other: A 5.2 cm x 3.7 cm x 7.6 cm fat containing left-sided para umbilical hernia is noted. No abdominopelvic ascites. Musculoskeletal: There are bilateral total hip replacements with associated streak artifact and subsequently limited evaluation of the adjacent osseous structures. Extensive chronic and postoperative changes are seen throughout the lumbar spine with marked severity degenerative changes noted throughout the visualized portion of the thoracic spine. IMPRESSION: 1. Large stool burden without evidence of bowel obstruction. 2. Cholelithiasis. 3. Small hiatal hernia. 4. Large fat containing left-sided para umbilical hernia. 5. Extensive chronic and postoperative changes throughout the lumbar spine. 6. Bilateral total hip replacements. 7. Aortic atherosclerosis. Aortic Atherosclerosis (ICD10-I70.0).  Electronically Signed   By: Virgina Norfolk M.D.   On: 09/29/2021 02:29    PROCEDURES and INTERVENTIONS:  .1-3 Lead EKG Interpretation  Performed by: Vladimir Crofts, MD Authorized by: Vladimir Crofts, MD     Interpretation: normal     ECG rate:  80   ECG rate assessment: normal     Rhythm: sinus rhythm     Ectopy: none     Conduction: normal     Medications  sodium chloride 0.9 % bolus 1,000 mL (0 mLs Intravenous Stopped 09/29/21 0126)  cefTRIAXone (ROCEPHIN) 2 g in sodium chloride 0.9 % 100 mL IVPB (0 g Intravenous Stopped 09/29/21 0056)  lactated ringers bolus 1,000 mL (0 mLs Intravenous Stopped 09/29/21 0250)     IMPRESSION / MDM / ASSESSMENT AND PLAN / ED COURSE  I reviewed the triage vital signs and the nursing notes.  Differential diagnosis includes, but is not limited to, sepsis, pyelonephritis, ureteral obstruction, acute cystitis, COVID or other viral syndrome, AKI  {Patient presents with symptoms  of an acute illness or injury that is potentially life-threatening.  71 year old female with history of recurrent UTIs presents to the ED with subacutely worsening subjective fevers and chills, malaise and chronic suprapubic discomfort.  She has signs of acute cystitis and slight worsening of CKD.  No leukocytosis or stigmata of sepsis.  Chronic and mild normocytic anemia is noted.  No signs or symptoms of acute blood loss.  We will provide fluid resuscitation, IV antibiotics and reassess.  She looks very well systemically and well I considered observation admission, outpatient management may be reasonable depending on her response to treatment here in the ED.  Clinical Course as of 09/29/21 0257  Thu Sep 29, 2021  0154 Reassessed.  Patient reports feeling well.  We discussed her work-up.  We discussed initiating antibiotics and close follow-up with her PCP.  We discussed return precautions.  Answered questions [DS]  0245 Reassessed.  Feels well.  Discussed CT results [DS]     Clinical Course User Index [DS] Vladimir Crofts, MD     FINAL CLINICAL IMPRESSION(S) / ED DIAGNOSES   Final diagnoses:  Acute cystitis with hematuria     Rx / DC Orders   ED Discharge Orders          Ordered    cefdinir (OMNICEF) 300 MG capsule  2 times daily        09/29/21 0154             Note:  This document was prepared using Dragon voice recognition software and may include unintentional dictation errors.   Vladimir Crofts, MD 09/29/21 [phone]

## 2021-09-29 ENCOUNTER — Emergency Department: Payer: Medicare PPO

## 2021-09-29 LAB — URINALYSIS, ROUTINE W REFLEX MICROSCOPIC
Bacteria, UA: NONE SEEN
Bilirubin Urine: NEGATIVE
Glucose, UA: NEGATIVE mg/dL
Ketones, ur: NEGATIVE mg/dL
Nitrite: NEGATIVE
Protein, ur: 30 mg/dL — AB
RBC / HPF: >50 RBC/hpf — ABNORMAL HIGH (ref 0–5)
Specific Gravity, Urine: 1.013 (ref 1.005–1.030)
WBC, UA: >50 WBC/hpf — ABNORMAL HIGH (ref 0–5)
pH: 5 (ref 5.0–8.0)

## 2021-09-29 MED ORDER — CEFDINIR 300 MG PO CAPS: 300.0000 mg | ORAL_CAPSULE | Freq: Two times a day (BID) | ORAL | 0 refills | Status: AC

## 2021-09-29 MED ORDER — LACTATED RINGERS IV BOLUS
1000.0000 mL | Freq: Once | INTRAVENOUS | Status: AC
  Administered 2021-09-29: 1000 mL via INTRAVENOUS

## 2021-09-29 NOTE — ED Notes (Signed)
Pt to CT

## 2021-09-29 NOTE — Discharge Instructions (Signed)
You are being discharged with prescription for cefdinir/Omnicef antibiotics.  As we discussed, please use MiraLAX to help with your constipation.  1-2 caps per dose, 1-2 times per day.  Return to the ED with any further worsening symptoms despite this..  Please follow-up with your PCP in the next week for recheck, including recheck of your blood work.

## 2021-09-29 NOTE — ED Notes (Signed)
E-signature pad unavailable - Pt verbalized understanding of D/C information - no additional concerns at this time.  

## 2021-10-01 LAB — URINE CULTURE: Culture: >=100000 — AB

## 2022-03-05 IMAGING — CR DG HIP (WITH OR WITHOUT PELVIS) 2-3V*L*
3 series · 3 of 3 positions shown · non-contrast
Comparison: CT abdomen pelvis 05/05/2009, x-ray hip 09/17/2019

CLINICAL DATA: Status post fall.

EXAM:
DG HIP (WITH OR WITHOUT PELVIS) 2-3V LEFT

[pelvis ap]
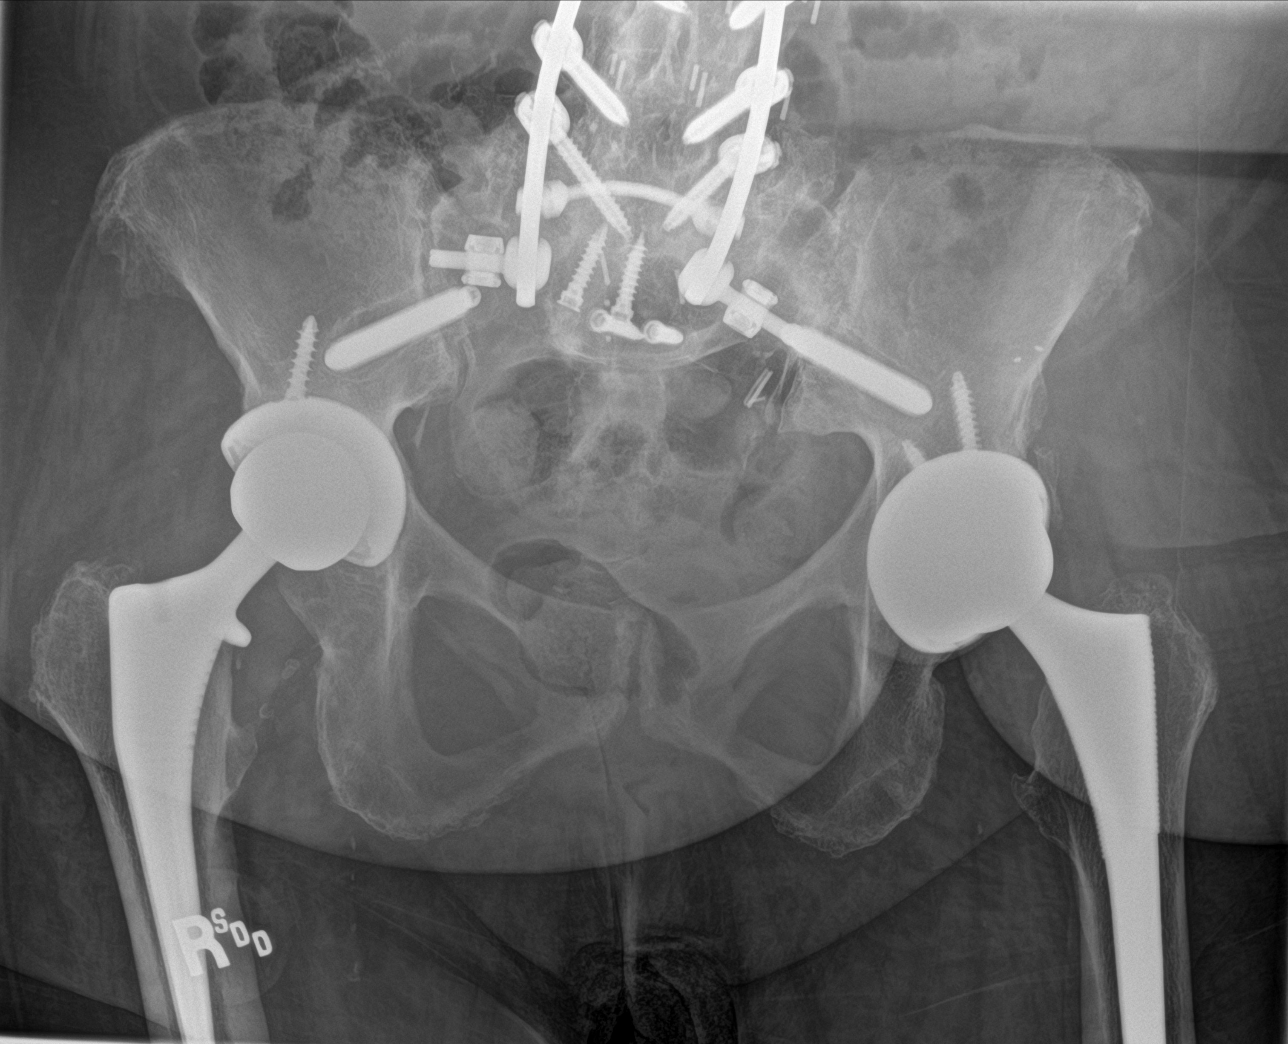

[hip ap]
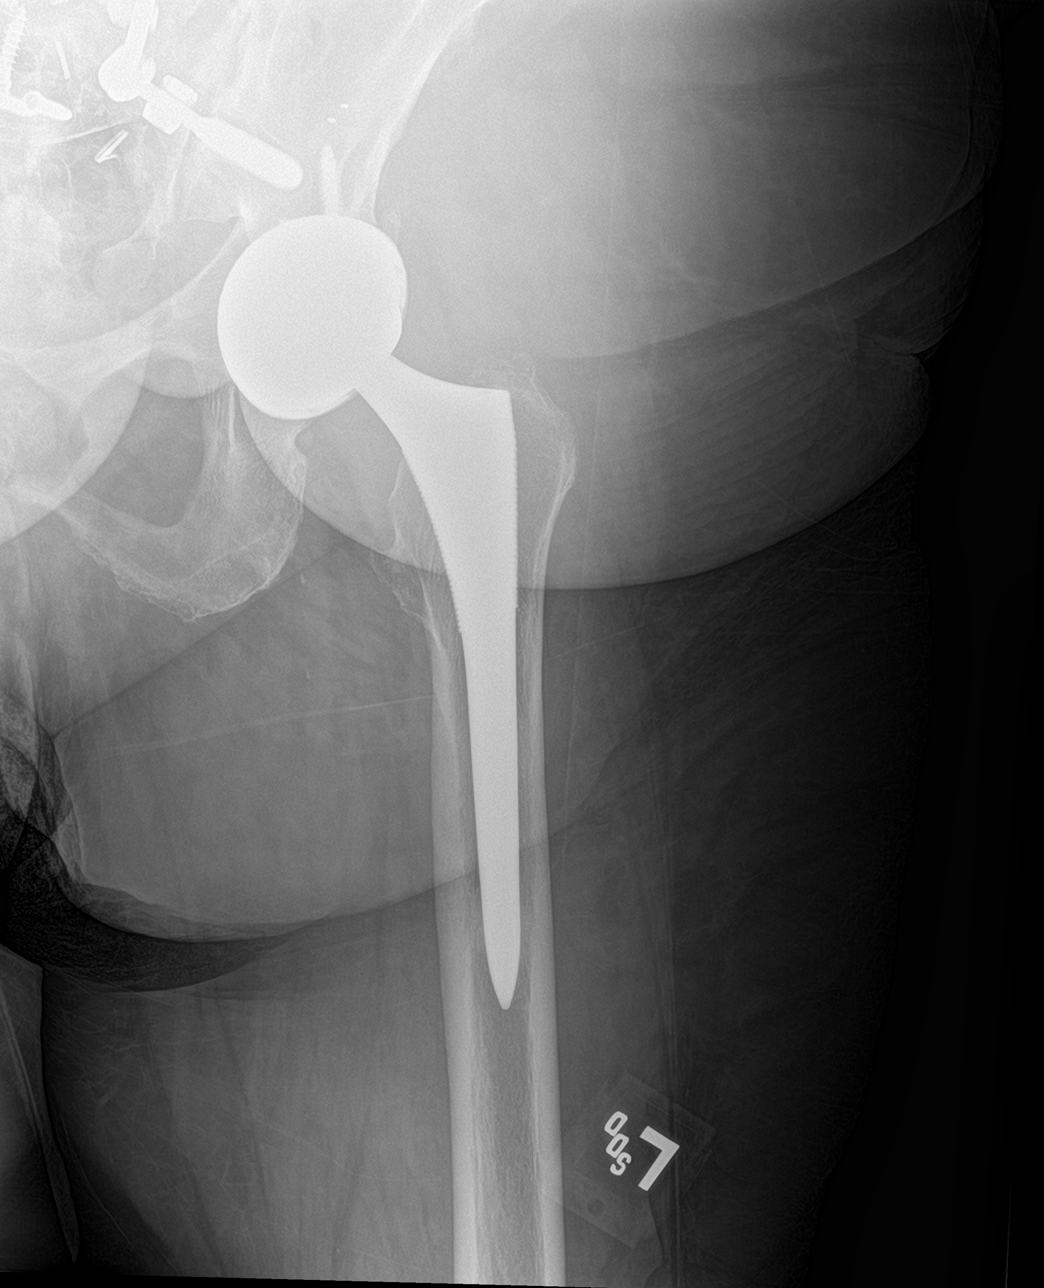

[hip lat]
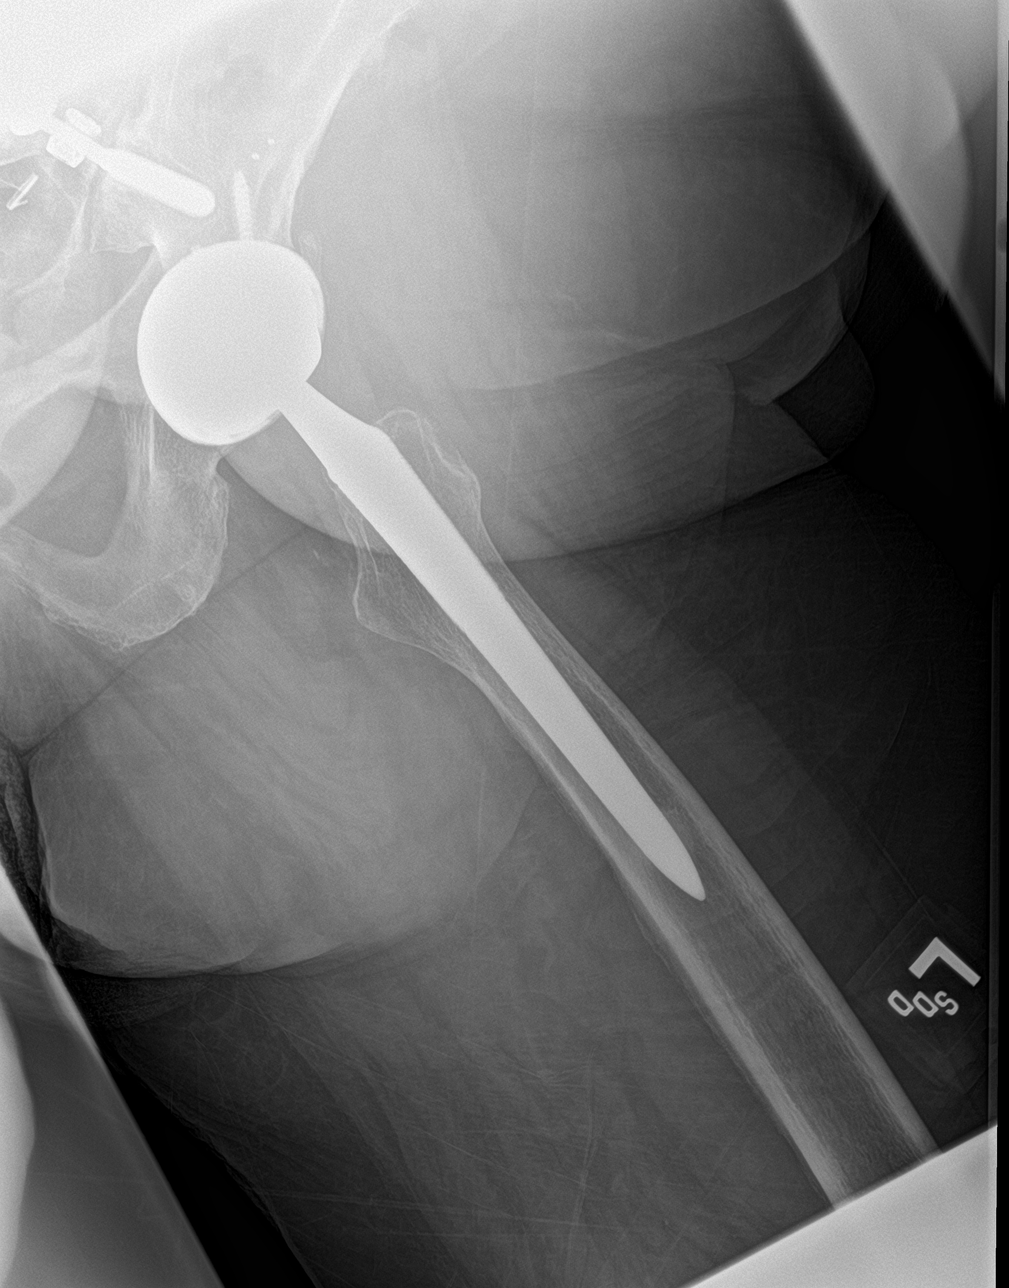

[3 of 3 positions shown; findings below may reference images not displayed]

FINDINGS: Status post bilateral total hip arthroplasties. Partially visualized
lumbosacral surgical hardware. Sacroiliac surgical hardware is also
present with the bilateral iliac screws demonstrating surrounding
lucencies.

There is no evidence of left hip fracture or dislocation.

No acute displaced fracture or diastasis of the pelvic bones.

Unable to evaluate the sacrum due to overlying bowel gas.

There is no aggressive appearing focal bone abnormality.
IMPRESSION: 1. No acute displaced fracture or diastasis of the bones of the
pelvis in a patient status post sacroiliac surgical hardware.
2. Bilateral iliac screws of the sacroiliac surgical hardware
demonstrate surrounding lucencies which is concerning for loosening.
Comparison with more recent pelvic x-ray would be of value to
evaluate chronicity of the findings.
3. No acute displaced fracture or dislocation of the left hip in a
patient status post total hip arthroplasty.
4. Grossly unremarkable frontal view of a partially visualized right
hip total arthroplasty.
5. Limited evaluation of CT lumbosacral surgical hardware.

## 2022-10-04 ENCOUNTER — Other Ambulatory Visit: Payer: Self-pay

## 2022-10-04 ENCOUNTER — Emergency Department
Admission: EM | Admit: 2022-10-04 | Discharge: 2022-10-04 | Disposition: A | Discharge status: Home / Self Care | Payer: Medicare PPO | Attending: Emergency Medicine | Admitting: Emergency Medicine

## 2022-10-04 DIAGNOSIS — E860 Dehydration: Secondary | ICD-10-CM | POA: Insufficient documentation

## 2022-10-04 DIAGNOSIS — N390 Urinary tract infection, site not specified: Secondary | ICD-10-CM | POA: Insufficient documentation

## 2022-10-04 DIAGNOSIS — Z20822 Contact with and (suspected) exposure to covid-19: Secondary | ICD-10-CM | POA: Diagnosis not present

## 2022-10-04 DIAGNOSIS — R350 Frequency of micturition: Secondary | ICD-10-CM | POA: Diagnosis present

## 2022-10-04 LAB — CBC
HCT: 37.2 % (ref 36.0–46.0)
Hemoglobin: 12.1 g/dL (ref 12.0–15.0)
MCH: 30.1 pg (ref 26.0–34.0)
MCHC: 32.5 g/dL (ref 30.0–36.0)
MCV: 92.5 fL (ref 80.0–100.0)
Platelets: 189 10*3/uL (ref 150–400)
RBC: 4.02 MIL/uL (ref 3.87–5.11)
RDW: 13.5 % (ref 11.5–15.5)
WBC: 8.1 10*3/uL (ref 4.0–10.5)
nRBC: 0 % (ref 0.0–0.2)

## 2022-10-04 LAB — URINALYSIS, ROUTINE W REFLEX MICROSCOPIC
Bilirubin Urine: NEGATIVE
Glucose, UA: NEGATIVE mg/dL
Ketones, ur: NEGATIVE mg/dL
Nitrite: POSITIVE — AB
Protein, ur: 100 mg/dL — AB
Specific Gravity, Urine: 1.009 (ref 1.005–1.030)
WBC, UA: >50 WBC/hpf (ref 0–5)
pH: 6 (ref 5.0–8.0)

## 2022-10-04 LAB — COMPREHENSIVE METABOLIC PANEL
ALT: 11 U/L (ref 0–44)
AST: 21 U/L (ref 15–41)
Albumin: 3.7 g/dL (ref 3.5–5.0)
Alkaline Phosphatase: 84 U/L (ref 38–126)
Anion gap: 9 (ref 5–15)
BUN: 30 mg/dL — ABNORMAL HIGH (ref 8–23)
CO2: 21 mmol/L — ABNORMAL LOW (ref 22–32)
Calcium: 8.4 mg/dL — ABNORMAL LOW (ref 8.9–10.3)
Chloride: 106 mmol/L (ref 98–111)
Creatinine, Ser: 1.78 mg/dL — ABNORMAL HIGH (ref 0.44–1.00)
GFR, Estimated: 30 mL/min — ABNORMAL LOW (ref >60)
Glucose, Bld: 92 mg/dL (ref 70–99)
Potassium: 4.5 mmol/L (ref 3.5–5.1)
Sodium: 136 mmol/L (ref 135–145)
Total Bilirubin: 1.1 mg/dL (ref 0.3–1.2)
Total Protein: 7.8 g/dL (ref 6.5–8.1)

## 2022-10-04 LAB — RESP PANEL BY RT-PCR (RSV, FLU A&B, COVID)  RVPGX2
Influenza A by PCR: NEGATIVE
Influenza B by PCR: NEGATIVE
Resp Syncytial Virus by PCR: NEGATIVE
SARS Coronavirus 2 by RT PCR: NEGATIVE

## 2022-10-04 LAB — LIPASE, BLOOD: Lipase: 24 U/L (ref 11–51)

## 2022-10-04 MED ORDER — SODIUM CHLORIDE 0.9 % IV BOLUS
1000.0000 mL | Freq: Once | INTRAVENOUS | Status: AC
  Administered 2022-10-04: 1000 mL via INTRAVENOUS

## 2022-10-04 MED ORDER — CIPROFLOXACIN IN D5W 400 MG/200ML IV SOLN
400.0000 mg | Freq: Once | INTRAVENOUS | Status: AC
  Administered 2022-10-04: 400 mg via INTRAVENOUS
  Filled 2022-10-04: qty 200

## 2022-10-04 MED ORDER — CIPROFLOXACIN HCL 500 MG PO TABS: 500.0000 mg | ORAL_TABLET | Freq: Two times a day (BID) | ORAL | 0 refills | Status: AC

## 2022-10-04 NOTE — Discharge Instructions (Signed)
Please seek medical attention for any high fevers, chest pain, shortness of breath, change in behavior, persistent vomiting, bloody stool or any other new or concerning symptoms.  

## 2022-10-04 NOTE — ED Triage Notes (Addendum)
Pt sts that she get chronic UTI's. Pt sts that today she is feeling pressure on the right flank area. Pt sts that she normally gets admitted due to her UTI's cause of the severity of them. Pt also sts that she has been having joint and body aches as well as a headache.

## 2022-10-04 NOTE — ED Provider Notes (Signed)
[facility] Provider Note    Event Date/Time   First MD Initiated Contact with Patient 10/04/22 1518     (approximate)   History   Urinary Frequency   HPI  Victoria Crane is a 72 y.o. female  who presents to the emergency department today because of concern for possible urinary tract infection.  Patient states that starting yesterday she noticed increased frequency of urination and hematuria.  She also had low-grade fevers.  Patient does have history of bladder issues and states that she has a nerve stimulator to tell her when she needs to urinate.  She also has history of recurrent UTIs.  At 1 point apparently there had been discussion about starting her on prophylactic antibiotics although she has not started doing this yet.  The patient has had admissions in the past for urinary tract infections.  She has had some bodyaches.  She states she has had decreased oral intake.      Physical Exam   Triage Vital Signs: ED Triage Vitals  Encounter Vitals Group     BP 10/04/22 1310 (!) 119/53     Systolic BP Percentile --      Diastolic BP Percentile --      Pulse Rate 10/04/22 1310 83     Resp 10/04/22 1310 17     Temp 10/04/22 1310 98.6 F (37 C)     Temp Source 10/04/22 1310 Oral     SpO2 10/04/22 1310 94 %     Weight 10/04/22 1311 185 lb (83.9 kg)     Height 10/04/22 1311 5\' 1"  (1.549 m)     Head Circumference --      Peak Flow --      Pain Score 10/04/22 1311 4     Pain Loc --      Pain Education --      Exclude from Growth Chart --     Most recent vital signs: Vitals:   10/04/22 1310  BP: (!) 119/53  Pulse: 83  Resp: 17  Temp: 98.6 F (37 C)  SpO2: 94%   General: Awake, alert, oriented. CV:  Good peripheral perfusion. Regular rate and rhythm. Resp:  Normal effort. Lungs clear. Abd:  No distention. Non tender.   ED Results / Procedures / Treatments   Labs (all labs ordered are listed, but only abnormal results are  displayed) Labs Reviewed  COMPREHENSIVE METABOLIC PANEL - Abnormal; Notable for the following components:      Result Value   CO2 21 (*)    BUN 30 (*)    Creatinine, Ser 1.78 (*)    Calcium 8.4 (*)    GFR, Estimated 30 (*)    All other components within normal limits  URINALYSIS, ROUTINE W REFLEX MICROSCOPIC - Abnormal; Notable for the following components:   Color, Urine YELLOW (*)    APPearance TURBID (*)    Hgb urine dipstick MODERATE (*)    Protein, ur 100 (*)    Nitrite POSITIVE (*)    Leukocytes,Ua LARGE (*)    Bacteria, UA RARE (*)    All other components within normal limits  RESP PANEL BY RT-PCR (RSV, FLU A&B, COVID)  RVPGX2  LIPASE, BLOOD  CBC     EKG  None   RADIOLOGY None  PROCEDURES:  Critical Care performed: No   MEDICATIONS ORDERED IN ED: Medications - No data to display   IMPRESSION / MDM / ASSESSMENT AND PLAN / ED COURSE  I reviewed  the triage vital signs and the nursing notes.                              Differential diagnosis includes, but is not limited to, UTI, kidney stone  Patient's presentation is most consistent with acute presentation with potential threat to life or bodily function.   The patient is on the cardiac monitor to evaluate for evidence of arrhythmia and/or significant heart rate changes.  Patient presented to the emergency department today with concerns for increased urinary frequency and hematuria.  Patient has history of recurrent UTIs.  UA today is concerning for infection.  No leukocytosis and blood work.  Patient is afebrile here.  No tachycardia.  Will plan on giving patient dose of IV antibiotics.  It appears that last UTI was susceptible to Cipro.  Additionally will give IV fluids. At this time given lack of fever, leukocytosis I think it is reasonable for patient to be treated as an outpatient. Discussed with patient and she felt comfortable with plan for discharge home.       FINAL CLINICAL IMPRESSION(S) / ED  DIAGNOSES   Final diagnoses:  Lower urinary tract infectious disease  Dehydration    Note:  This document was prepared using Dragon voice recognition software and may include unintentional dictation errors.    Phineas Semen, MD 10/04/22 [phone]

## 2022-10-04 NOTE — ED Notes (Signed)
Phlebotomy called for lab collection.  °

## 2023-01-07 IMAGING — CT CT RENAL STONE PROTOCOL
2 of 4 series · 17 of 46 positions shown, 19 images · non-contrast
Comparison: 06/02/2020

CLINICAL DATA: Flank pain, kidney stone suspected.  Hematuria.



[Series 2: stone full standard · axial · 0.88mm/px · z∈[-600,-190]mm · 14 of 90 slices shown, 16 images]
[im 4/90  soft-tissue]
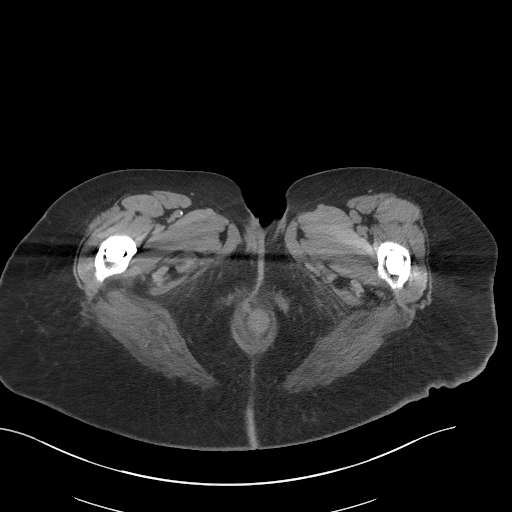
[im 4/90  bone]
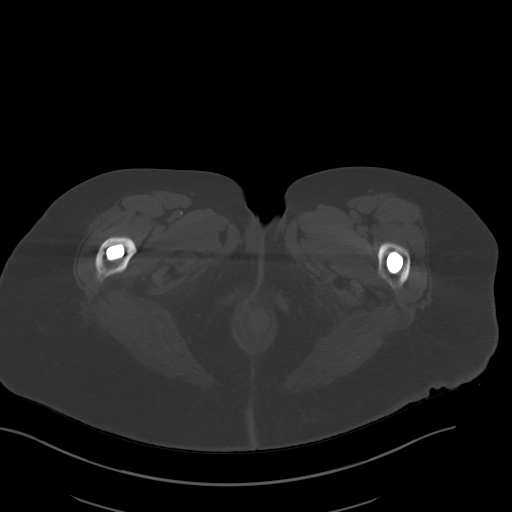
[im 12/90  soft-tissue]
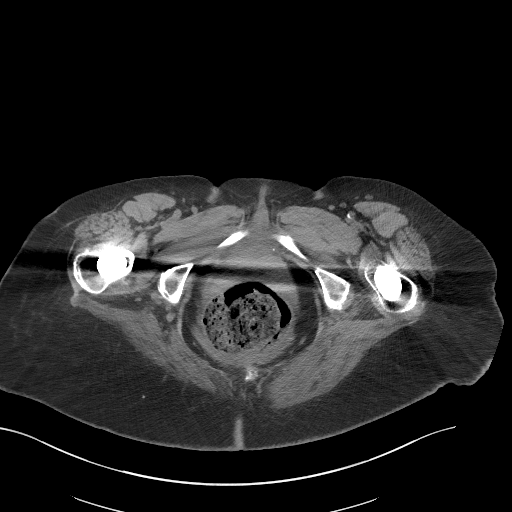
[im 19/90  soft-tissue]
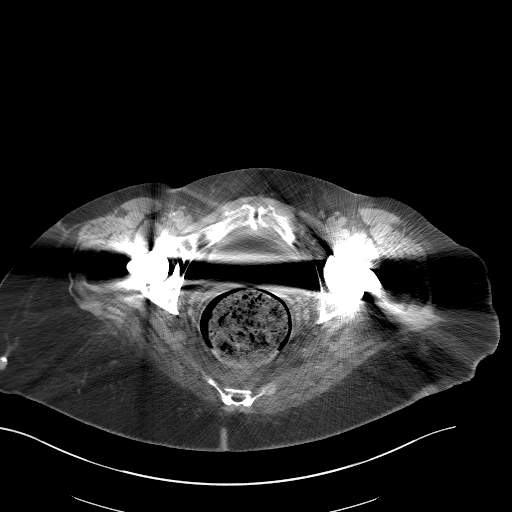
[im 23/90  soft-tissue]
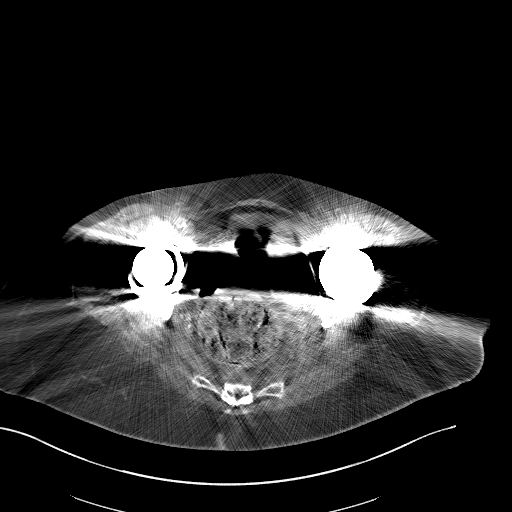
[im 30/90  soft-tissue]
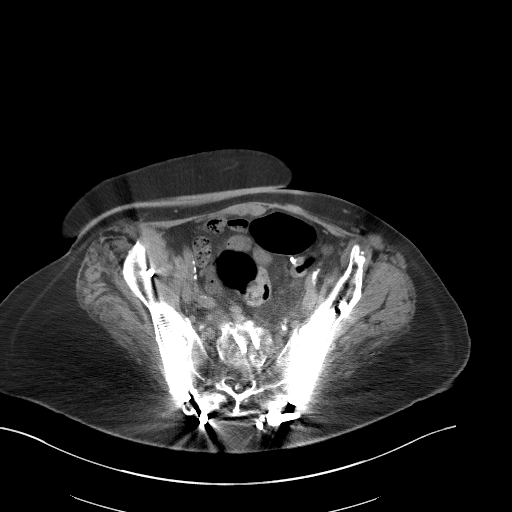
[im 38/90  soft-tissue]
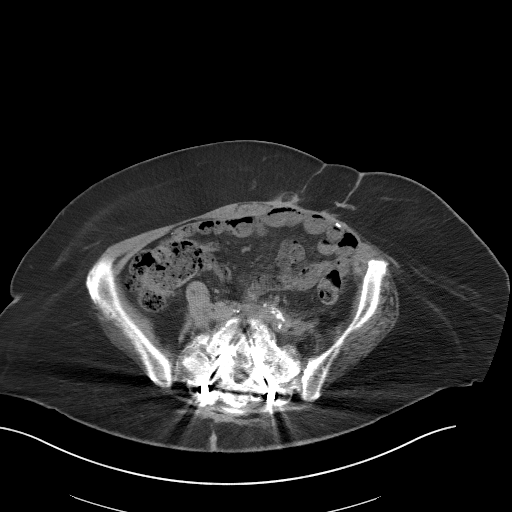
[im 41/90  soft-tissue]
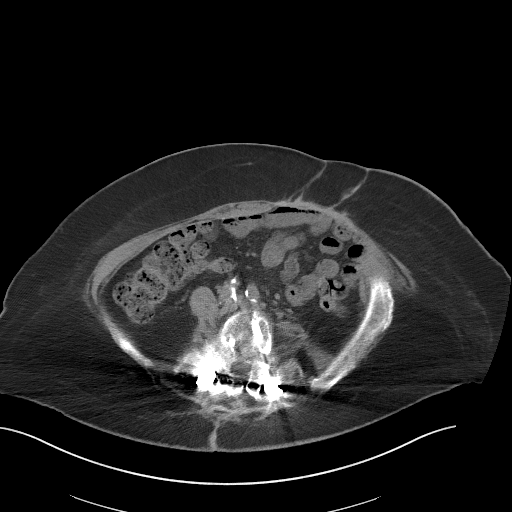
[im 49/90  soft-tissue]
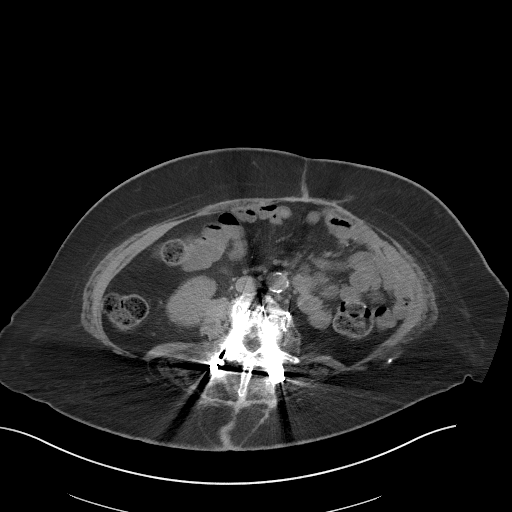
[im 52/90  soft-tissue]
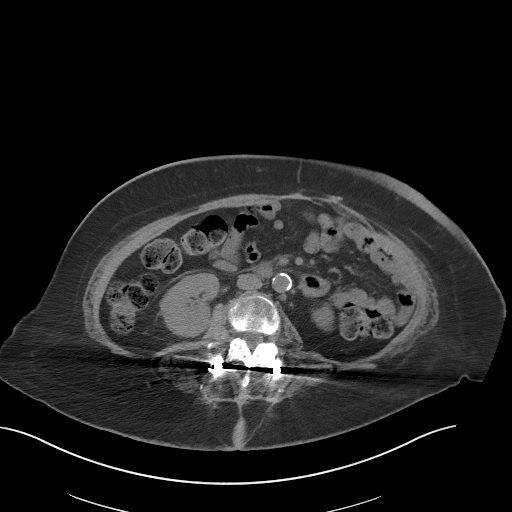
[im 52/90  bone]
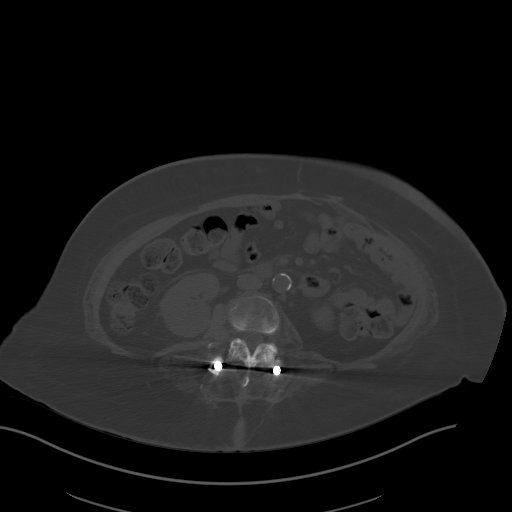
[im 60/90  soft-tissue]
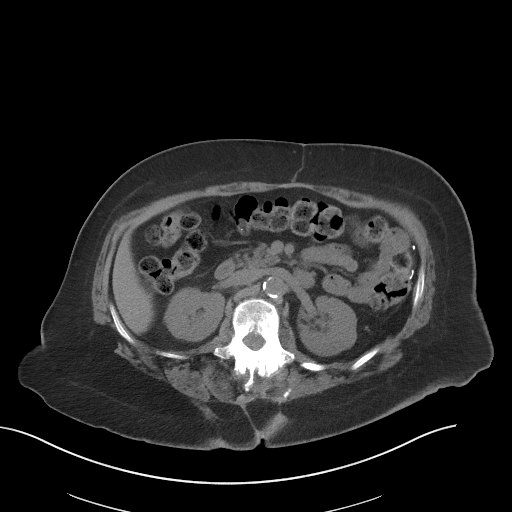
[im 67/90  soft-tissue]
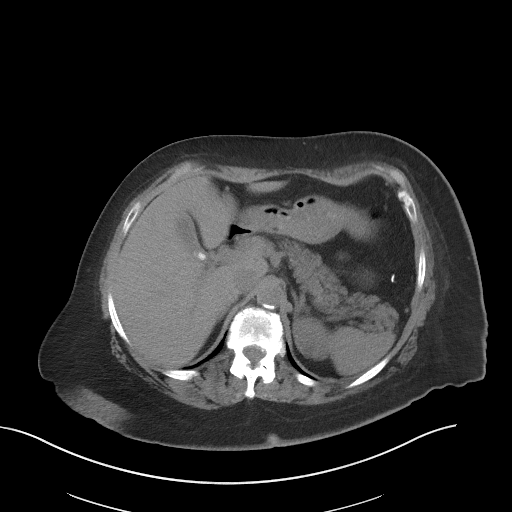
[im 71/90  soft-tissue]
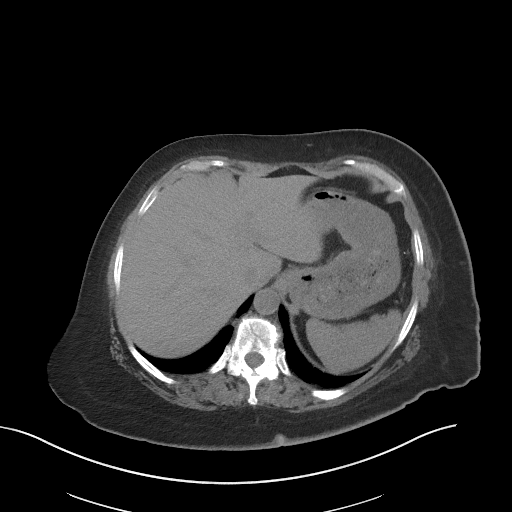
[im 78/90  soft-tissue]
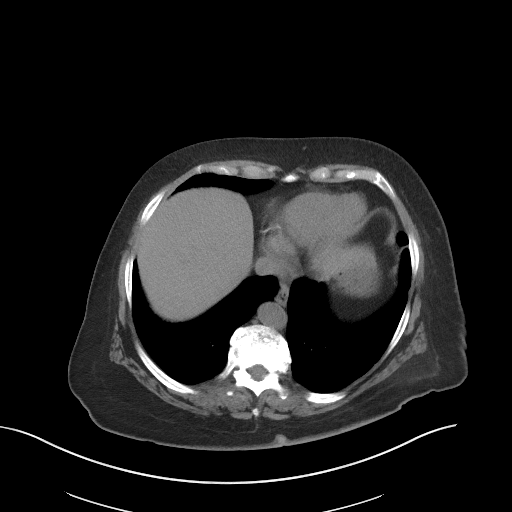
[im 86/90  soft-tissue]
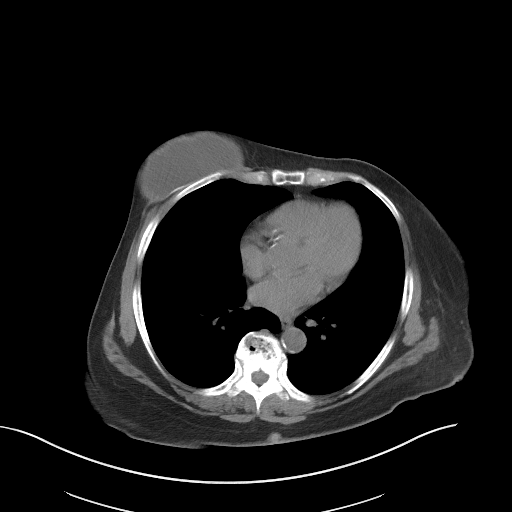

[Series 5: coronal · coronal · 0.90mm/px · 3 of 135 slices shown]
[im 45/135  soft-tissue]
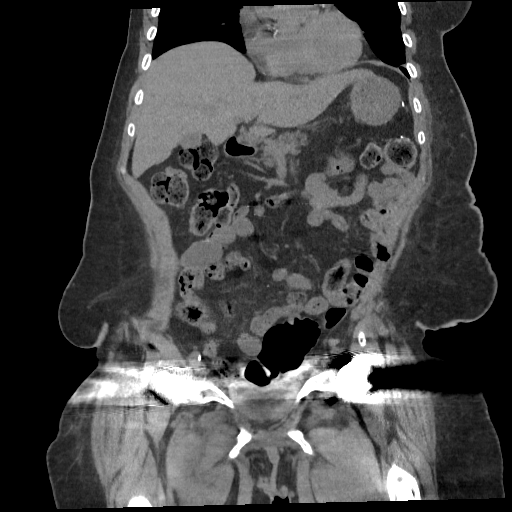
[im 60/135  soft-tissue]
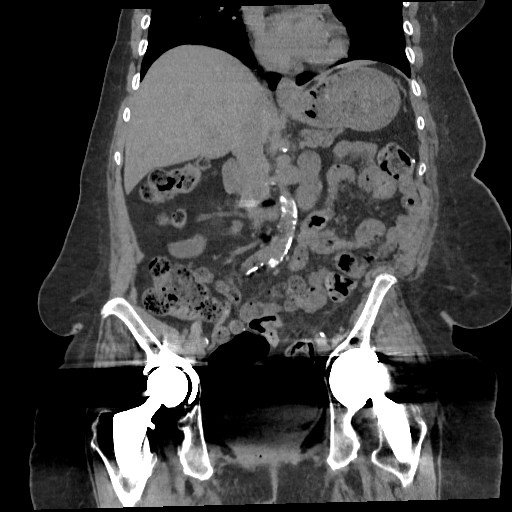
[im 75/135  soft-tissue]
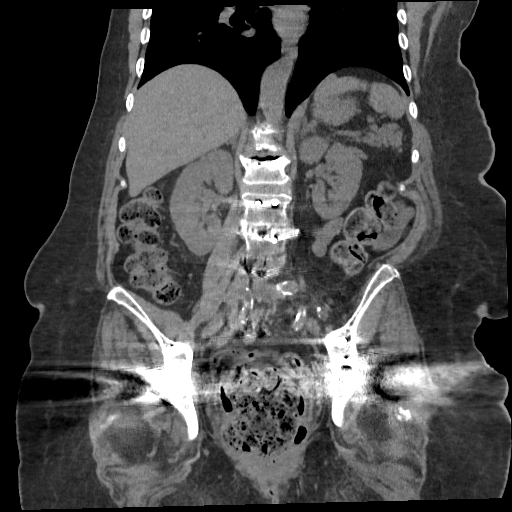

[17 of 46 positions shown; findings below may reference images not displayed]

FINDINGS: Lower chest: No acute abnormality.

Hepatobiliary: 6 mm gallstone within the gallbladder which is
contracted. No biliary ductal dilatation. No focal hepatic
abnormality.

Pancreas: No focal abnormality or ductal dilatation.

Spleen: No focal abnormality.  Normal size.

Adrenals/Urinary Tract: Small low-density lesions in the midpole of
the right kidney and upper pole of the left kidney, likely small
cysts. No stones or hydronephrosis. Adrenal glands unremarkable.
Urinary bladder obscured by beam hardening artifact from bilateral
hip replacements.

Stomach/Bowel: Large stool burden throughout the colon. Normal
appendix. Stomach, large and small bowel grossly unremarkable.

Vascular/Lymphatic: Aortic atherosclerosis. No evidence of aneurysm
or adenopathy.

Reproductive: Obscured by beam hardening artifact from bilateral hip
replacements.

Other: No free fluid or free air.

Musculoskeletal: Bilateral hip replacements. Postoperative changes
in lumbar spine. No acute bony abnormality.
IMPRESSION: No renal or ureteral stones.  No hydronephrosis.

Cholelithiasis.

Aortic atherosclerosis.

Large stool burden in the colon.

## 2023-11-09 NOTE — ED Provider Notes (Signed)
 " Department of Emergency Medicine Adelita JONETTA Satchel, MD  Encounter Note 11/09/2023   Patient Name: Victoria Crane MRN: [id number] Birthdate 1950/07/26   History of Present Illness  Chief Complaint: Flank Pain  History obtained from: chart review and the patient. Victoria Crane is a 73 y.o. female with notable past medical history of HTN, HLD, IBS, former tobacco use, recurrent UTIs who presents for evaluation of R flank pain, lower abdominal pain, and dysuria.  Sx ongoing for past few days with worsening, associated fatigue.  She does report fever as well for past couple days Tmax 101.  She reports sx feel like prior UTIs.  Some nausea but no vomiting. No problems stooling.      Prior History  Allergies, Medications, Medical, Surgical, and Social History were reviewed as documented below. Allergies: is allergic to augmentin [amoxicillin-pot clavulanate], ciprofloxacin , codeine, darvon [propoxyphene], ditropan [oxybutynin chloride], indocin [indomethacin sodium], iodinated contrast, lipitor [atorvastatin], lisinopril, macrobid [nitrofurantoin monohyd/m-cryst], niacin, reglan [metoclopramide hcl], salmon oil, sulfa (sulfonamide antibiotics), and zocor [simvastatin]. 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 PSHx: has a past surgical history that includes Laminectomy and Hip surgery (Left). SocHx: reports that she quit smoking about 22 years ago. Her smoking use included cigarettes. She started smoking about 50 years ago. She has been exposed to tobacco smoke. She does not have any smokeless tobacco history on file. She reports that she does not use drugs. Medications: has a current medication list which includes the following long-term medication(s): cyclobenzaprine, diclofenac, dicyclomine , docusate sodium , ezetimibe , famotidine, ferrous  sulfate, fluticasone propionate, losartan, ondansetron , ondansetron , and vitamin e.    Review of Systems (2-9 systems for level 4, 10 or more for level 5)   A 12 point review of symptoms was completed and other than documented above in HPI, was negative for any findings.   Physical Exam (up to 7 for level 4, 8 or more for level 5)    Current Range (last 24 hours)  Temp: 97.8 F (36.6 C)  [97.8 F (36.6 C)]   HR: 65  [65-81]   RR: 18  [18-20]   BP: (!) 154/77 (154-155)/(77-78)   Sats: 96 %  [96 %]   Ideal body weight: 50.1 kg (110 lb 7.2 oz) Adjusted ideal body weight: 64.5 kg (142 lb 4.3 oz)  Physical Exam:  Vitals: BP (!) 154/77   Pulse 65   Temp 97.8 F (36.6 C) (Oral)   Resp 18   Ht 157.5 cm (62)   Wt 86.2 kg (190 lb)   SpO2 96%   BMI 34.75 kg/m  General: Well-developed. Well-nourished. Skin: Warm, dry Head: Normocephalic. Atraumatic. Neck: Trachea midline. Supple.  Eye: Normal conjunctiva. EOMI. ENT: Oropharynx clear. Moist mucous membranes. Cardiovascular: Regular rate. Normal rhythm. No murmur appreciated. Normal capillary refill.  Radial and DP pulses 2+ and equal BL. Respiratory: No acute respiratory distress. Clear to auscultation bilaterally. No wheezing or rales appreciated. Abdomen: Soft. Non-distended. Non-tender. No mass appreciated. Extremities: No gross deformity or noted trauma. No edema. Neurologic: Awake. Alert and oriented x3. Fluent, comprehensive speech. Cranial nerves II-XII grossly intact. Moves all 4 extremities equally.  Psychiatric: Cooperative, appropriate affect.    Medical Decision Making - Differential and plan  73 y.o. female with notable past medical history of HTN, HLD, IBS, former tobacco use, recurrent UTIs who presents for evaluation of R flank pain, lower abdominal pain, and dysuria.  Remainder of history and physical exam as documented above.  I have lower suspicion for an acute intra-abdominal pathology, including aortic pathology,  visceral ischemia, appendicitis, pancreatitis, cholecystitis, hepatitis, obstruction, volvulus, perforation as the patient appears comfortable and has a benign abdominal exam with no rebound or guarding.  Possible pyelonephritis or stone with UTI.  Labs were obtained which revealed mild Cr elevation, otherwise no significant cell count abnormalities or electrolyte dysfunction, and normal liver function.  Urinalysis reveals findings c/w UTI.  Reviewed cultures in the past that have been positive and all sensitive to cephalosporins.  Given IV Ceftriaxone  here in ED.  CT does not show stones - noted chronic hydro.    The patient is clinically very well-appearing on repeat examination, afebrile, and has stable vital signs. Discussed admission but pt would prefer discharge.  I do feel this is reasonable given sx control, normal VS>  Return precautions were discussed, including inability to tolerate PO, severe abdominal pain, fevers, or if they have any other concerns. I advised to follow up with primary care physician soon. Plan was discussed and understanding was voiced upon completion of discussion.   Independent interpretation of results and subsequent plan as continued below in MDM -ED course:   Medical Decision Making - ED Course  ED Course as of 11/09/23 1521  Fri Nov 09, 2023  1309 Blood, UA(!): Trace [AH]  1309 Leukocyte Esterase, UA(!): Large [AH]  1309 Ketones, UA: Negative [AH]  1309 Nitrite, UA: Negative [AH]  1319 WBC, UA(!): >182 [AH]  1319 RBC, UA(!): 7 [AH]  1319 Budding Yeast, UA(!): Few [AH]  1319 WBC Clumps, UA(!): Few [AH]  1319 Bacteria, UA(!): Moderate [AH]  1326 WBC: 8.5 [AH]  1327 Hemoglobin(!): 10.0 [AH]  1327 Platelets: 277 [AH]  1408 Sodium: 138 [AH]  1408 Potassium: 4.7 [AH]  1408 Chloride: 106 [AH]  1408 CO2(!): 18 [AH]  1408 Anion Gap: 14 [AH]  1408 BUN(!): 35 [AH]  1408 Creatinine, Serum(!): 1.87 [AH]  1408 AST: 19 [AH]  1408 ALT(!): 5 [AH]  1408 Bilirubin,  Total: 0.4 [AH]  1409 Procalcitonin(!): 0.51 [AH]  1409 Lactate: 0.7 [AH]  1512 CT Abdomen Pelvis wo Contrast 1.       Redemonstration of chronic severe bilateral hydronephrosis. Nonobstructing renal calculi is identified on exam.   2.       There is diffuse bladder wall thickening suggestive of a infectious versus inflammatory cystitis.  [AH]    ED Course User Index [AH] Rosabel Adelita BIRCH, MD      Clinical Impressions as of 11/09/23 1521  Pyelonephritis    MEDICATIONS ORDERED DURING VISIT: Medications  sodium chloride  bolus (NS) 0.9 % 1,000 mL (1,000 mL Intravenous New Bag 11/09/23 1519)  cefTRIAXone  (ROCEPHIN ) 2 g in sodium chloride  (NS) 0.9 % 50 mL IVPB-MINI (0 g Intravenous Stopped 11/09/23 1518)    DISCHARGE MEDICATIONS PRESCRIBED: New Prescriptions   CEFPODOXIME (VANTIN) 200 MG TABLET    Take 1 tablet (200 mg) by mouth 2 (two) times a day for 10 days      Disposition  Impression:  1. Pyelonephritis    Condition: Stable Disposition: DISCHARGE. This course of action was discussed with the patient and/or family.  Discussed with patient the limitations of our testing/workup.  Patient agrees with our impression. Patient was given strict return instructions at bedside and in discharge papers. They were amenable to this, verbalized understanding, and were without further questions.      This chart has been completed using Dragon Medical  Dictation software, and while attempts have been made to ensure accuracy, certain words and phrases may not be transcribed as intended.    Rosabel Adelita BIRCH, MD 11/09/23 [phone]  "

## 2023-12-27 NOTE — Progress Notes (Signed)
 Program Manager Note Home Health     Home Health Evaluation Date: 12/28/23   Referral Source (list name of Discharging Facility): Urology clinic 1[facility]   Are you currently receiving Home Health Services from another agency? __ Yes _x_ No  Language Line: __Yes __xNo   Verified Attending is Harlene Edsel Shaver per order  Disciplines (SN, PT, OT, ST, SW, Aide): SN   Specific reason for referral:    X__Wound care    __Education    __Tube feeds    __Trach    __Ostomy    __Trilogy, CPAP, BiPAP    __DME    __LVAD    __Pacemaker    __ Infusion    __O2      Patient requires a two-person transfer:   Yes_____   No___X__    Patient requires the use of a Hoyer lift:   Yes_____   No__X___    Dialysis? _____ Yes  __X__ No  Schedule: ______  Person and phone number called for scheduling admission visit: Cristol Engdahl [phone]   Has Identifiable Caregiver post discharge __x__Yes ____No if yes, identify caregiver: son Quita   Education visit completed with: Barnie Bellini  At [facility] we want to ensure everyone receiving our care and those delivering care are safe and happy during their visit. I am going to share with you some important rules that are to be followed before and during your time of care. As it relates to weapons- please ensure prior to the arrival of our clinicians, that all guns or weapons are put in a different room from where care will be delivered and remain there throughout the duration of the visit. As it relates to pets-If you have any pets, prior to our clinician entering the home, please put them in a kennel or secure them in a room separate from where your care will be delivered to ensure they do not come in any contact with our staff.

## 2023-12-28 NOTE — Progress Notes (Signed)
 Rx Keflex 500 mg BID X 7 days while culture pending. Triage staff to notify patient.

## 2024-02-13 ENCOUNTER — Observation Stay
Admission: EM | Admit: 2024-02-13 | Discharge: 2024-02-17 | Disposition: A | Attending: Internal Medicine | Admitting: Internal Medicine

## 2024-02-13 DIAGNOSIS — Z85038 Personal history of other malignant neoplasm of large intestine: Secondary | ICD-10-CM | POA: Insufficient documentation

## 2024-02-13 DIAGNOSIS — Z853 Personal history of malignant neoplasm of breast: Secondary | ICD-10-CM | POA: Insufficient documentation

## 2024-02-13 DIAGNOSIS — Z96653 Presence of artificial knee joint, bilateral: Secondary | ICD-10-CM | POA: Diagnosis not present

## 2024-02-13 DIAGNOSIS — W19XXXA Unspecified fall, initial encounter: Secondary | ICD-10-CM | POA: Diagnosis not present

## 2024-02-13 DIAGNOSIS — Z96643 Presence of artificial hip joint, bilateral: Secondary | ICD-10-CM | POA: Insufficient documentation

## 2024-02-13 DIAGNOSIS — N390 Urinary tract infection, site not specified: Principal | ICD-10-CM | POA: Diagnosis present

## 2024-02-13 DIAGNOSIS — Z87891 Personal history of nicotine dependence: Secondary | ICD-10-CM | POA: Diagnosis not present

## 2024-02-13 DIAGNOSIS — S31829A Unspecified open wound of left buttock, initial encounter: Secondary | ICD-10-CM | POA: Insufficient documentation

## 2024-02-13 DIAGNOSIS — R109 Unspecified abdominal pain: Secondary | ICD-10-CM | POA: Diagnosis present

## 2024-02-13 DIAGNOSIS — F32A Depression, unspecified: Secondary | ICD-10-CM | POA: Insufficient documentation

## 2024-02-13 DIAGNOSIS — Z79899 Other long term (current) drug therapy: Secondary | ICD-10-CM | POA: Insufficient documentation

## 2024-02-13 DIAGNOSIS — N10 Acute tubulo-interstitial nephritis: Secondary | ICD-10-CM | POA: Diagnosis not present

## 2024-02-13 DIAGNOSIS — K219 Gastro-esophageal reflux disease without esophagitis: Secondary | ICD-10-CM | POA: Diagnosis not present

## 2024-02-13 DIAGNOSIS — E785 Hyperlipidemia, unspecified: Secondary | ICD-10-CM | POA: Diagnosis present

## 2024-02-13 DIAGNOSIS — R509 Fever, unspecified: Secondary | ICD-10-CM | POA: Diagnosis present

## 2024-02-13 DIAGNOSIS — N12 Tubulo-interstitial nephritis, not specified as acute or chronic: Secondary | ICD-10-CM | POA: Insufficient documentation

## 2024-02-13 DIAGNOSIS — G629 Polyneuropathy, unspecified: Secondary | ICD-10-CM | POA: Insufficient documentation

## 2024-02-13 DIAGNOSIS — I10 Essential (primary) hypertension: Secondary | ICD-10-CM | POA: Diagnosis not present

## 2024-02-13 LAB — CBC
HCT: 38.2 % (ref 36.0–46.0)
HCT: 32.4 % — ABNORMAL LOW (ref 36.0–46.0)
Hemoglobin: 12.5 g/dL (ref 12.0–15.0)
Hemoglobin: 10.6 g/dL — ABNORMAL LOW (ref 12.0–15.0)
MCH: 30.3 pg (ref 26.0–34.0)
MCH: 30.4 pg (ref 26.0–34.0)
MCHC: 32.7 g/dL (ref 30.0–36.0)
MCHC: 32.7 g/dL (ref 30.0–36.0)
MCV: 92.7 fL (ref 80.0–100.0)
MCV: 92.8 fL (ref 80.0–100.0)
Platelets: 324 K/uL (ref 150–400)
Platelets: 283 K/uL (ref 150–400)
RBC: 4.12 MIL/uL (ref 3.87–5.11)
RBC: 3.49 MIL/uL — ABNORMAL LOW (ref 3.87–5.11)
RDW: 12.5 % (ref 11.5–15.5)
RDW: 12.7 % (ref 11.5–15.5)
WBC: 12.6 K/uL — ABNORMAL HIGH (ref 4.0–10.5)
WBC: 15.8 K/uL — ABNORMAL HIGH (ref 4.0–10.5)
nRBC: 0 % (ref 0.0–0.2)
nRBC: 0 % (ref 0.0–0.2)

## 2024-02-13 LAB — COMPREHENSIVE METABOLIC PANEL WITH GFR
ALT: <5 U/L (ref 0–44)
AST: 16 U/L (ref 15–41)
Albumin: 3.8 g/dL (ref 3.5–5.0)
Alkaline Phosphatase: 108 U/L (ref 38–126)
Anion gap: 15 (ref 5–15)
BUN: 49 mg/dL — ABNORMAL HIGH (ref 8–23)
CO2: 17 mmol/L — ABNORMAL LOW (ref 22–32)
Calcium: 9.6 mg/dL (ref 8.9–10.3)
Chloride: 102 mmol/L (ref 98–111)
Creatinine, Ser: 2.01 mg/dL — ABNORMAL HIGH (ref 0.44–1.00)
GFR, Estimated: 26 mL/min — ABNORMAL LOW
Glucose, Bld: 131 mg/dL — ABNORMAL HIGH (ref 70–99)
Potassium: 4.7 mmol/L (ref 3.5–5.1)
Sodium: 134 mmol/L — ABNORMAL LOW (ref 135–145)
Total Bilirubin: 0.6 mg/dL (ref 0.0–1.2)
Total Protein: 8.4 g/dL — ABNORMAL HIGH (ref 6.5–8.1)

## 2024-02-13 LAB — LACTIC ACID, PLASMA
Lactic Acid, Venous: 1.7 mmol/L (ref 0.5–1.9)
Lactic Acid, Venous: 0.6 mmol/L (ref 0.5–1.9)

## 2024-02-13 LAB — CREATININE, SERUM
Creatinine, Ser: 1.66 mg/dL — ABNORMAL HIGH (ref 0.44–1.00)
GFR, Estimated: 32 mL/min — ABNORMAL LOW

## 2024-02-13 LAB — URINALYSIS, ROUTINE W REFLEX MICROSCOPIC
Bilirubin Urine: NEGATIVE
Glucose, UA: NEGATIVE mg/dL
Ketones, ur: NEGATIVE mg/dL
Nitrite: POSITIVE — AB
Protein, ur: 100 mg/dL — AB
Specific Gravity, Urine: 1.013 (ref 1.005–1.030)
WBC, UA: >50 WBC/hpf (ref 0–5)
pH: 5 (ref 5.0–8.0)

## 2024-02-13 LAB — LIPASE, BLOOD: Lipase: 13 U/L (ref 11–51)

## 2024-02-13 MED ORDER — ENOXAPARIN SODIUM 40 MG/0.4ML IJ SOSY
40.0000 mg | PREFILLED_SYRINGE | INTRAMUSCULAR | Status: DC
  Administered 2024-02-13 – 2024-02-16 (×4): 40 mg via SUBCUTANEOUS
  Filled 2024-02-13 (×4): qty 0.4

## 2024-02-13 MED ORDER — OXYCODONE HCL 5 MG PO TABS
5.0000 mg | ORAL_TABLET | ORAL | Status: DC | PRN
  Administered 2024-02-14 – 2024-02-16 (×3): 5 mg via ORAL
  Filled 2024-02-13 (×3): qty 1

## 2024-02-13 MED ORDER — POTASSIUM CHLORIDE CRYS ER 20 MEQ PO TBCR: 20.0000 meq | EXTENDED_RELEASE_TABLET | Freq: Every day | ORAL | Status: DC

## 2024-02-13 MED ORDER — LOSARTAN POTASSIUM 50 MG PO TABS
50.0000 mg | ORAL_TABLET | Freq: Every day | ORAL | Status: DC
  Administered 2024-02-14 – 2024-02-17 (×3): 50 mg via ORAL
  Filled 2024-02-13 (×3): qty 1

## 2024-02-13 MED ORDER — PANTOPRAZOLE SODIUM 40 MG PO TBEC
40.0000 mg | DELAYED_RELEASE_TABLET | Freq: Two times a day (BID) | ORAL | Status: DC
  Administered 2024-02-14 – 2024-02-17 (×8): 40 mg via ORAL
  Filled 2024-02-13 (×8): qty 1

## 2024-02-13 MED ORDER — SODIUM CHLORIDE 0.9 % IV SOLN: 2.0000 g | INTRAVENOUS | Status: DC

## 2024-02-13 MED ORDER — ONDANSETRON HCL 4 MG/2ML IJ SOLN
4.0000 mg | Freq: Once | INTRAMUSCULAR | Status: AC
  Administered 2024-02-13: 4 mg via INTRAVENOUS
  Filled 2024-02-13: qty 2

## 2024-02-13 MED ORDER — OXYBUTYNIN CHLORIDE 5 MG PO TABS
5.0000 mg | ORAL_TABLET | Freq: Two times a day (BID) | ORAL | Status: DC
  Administered 2024-02-14 – 2024-02-17 (×7): 5 mg via ORAL
  Filled 2024-02-13 (×7): qty 1

## 2024-02-13 MED ORDER — FENTANYL CITRATE (PF) 50 MCG/ML IJ SOSY: 25.0000 ug | PREFILLED_SYRINGE | INTRAMUSCULAR | Status: DC | PRN

## 2024-02-13 MED ORDER — ONDANSETRON HCL 4 MG/2ML IJ SOLN: 4.0000 mg | Freq: Four times a day (QID) | INTRAMUSCULAR | Status: DC | PRN

## 2024-02-13 MED ORDER — SODIUM CHLORIDE 0.9 % IV SOLN
2.0000 g | Freq: Three times a day (TID) | INTRAVENOUS | Status: DC
  Administered 2024-02-14 – 2024-02-15 (×2): 2 g via INTRAVENOUS
  Filled 2024-02-13 (×3): qty 12.5

## 2024-02-13 MED ORDER — FERROUS SULFATE 325 (65 FE) MG PO TABS
325.0000 mg | ORAL_TABLET | Freq: Every day | ORAL | Status: DC
  Administered 2024-02-14 – 2024-02-17 (×4): 325 mg via ORAL
  Filled 2024-02-13 (×4): qty 1

## 2024-02-13 MED ORDER — ACETAMINOPHEN 650 MG RE SUPP: 650.0000 mg | Freq: Four times a day (QID) | RECTAL | Status: DC | PRN

## 2024-02-13 MED ORDER — SODIUM CHLORIDE 0.9 % IV SOLN: 2.0000 g | Freq: Three times a day (TID) | INTRAVENOUS | Status: DC

## 2024-02-13 MED ORDER — LACTATED RINGERS IV BOLUS
1000.0000 mL | Freq: Once | INTRAVENOUS | Status: AC
  Administered 2024-02-13: 1000 mL via INTRAVENOUS

## 2024-02-13 MED ORDER — FLUOXETINE HCL 20 MG PO CAPS
20.0000 mg | ORAL_CAPSULE | Freq: Every day | ORAL | Status: DC
  Administered 2024-02-13 – 2024-02-16 (×4): 20 mg via ORAL
  Filled 2024-02-13 (×5): qty 1

## 2024-02-13 MED ORDER — SODIUM CHLORIDE 0.9 % IV SOLN
2.0000 g | Freq: Once | INTRAVENOUS | Status: AC
  Administered 2024-02-13: 2 g via INTRAVENOUS
  Filled 2024-02-13: qty 12.5

## 2024-02-13 MED ORDER — LACTATED RINGERS IV SOLN: INTRAVENOUS | Status: AC

## 2024-02-13 MED ORDER — ONDANSETRON HCL 4 MG PO TABS: 4.0000 mg | ORAL_TABLET | Freq: Four times a day (QID) | ORAL | Status: DC | PRN

## 2024-02-13 MED ORDER — POLYETHYLENE GLYCOL 3350 17 G PO PACK
17.0000 g | PACK | Freq: Every day | ORAL | Status: DC | PRN
  Administered 2024-02-16 – 2024-02-17 (×2): 17 g via ORAL
  Filled 2024-02-13 (×2): qty 1

## 2024-02-13 MED ORDER — GABAPENTIN 300 MG PO CAPS
600.0000 mg | ORAL_CAPSULE | Freq: Two times a day (BID) | ORAL | Status: DC
  Administered 2024-02-13 – 2024-02-14 (×3): 600 mg via ORAL
  Administered 2024-02-15: 300 mg via ORAL
  Filled 2024-02-13 (×4): qty 2

## 2024-02-13 MED ORDER — SODIUM CHLORIDE 0.9 % IV SOLN: 1.0000 g | INTRAVENOUS | Status: DC

## 2024-02-13 MED ORDER — EZETIMIBE 10 MG PO TABS
10.0000 mg | ORAL_TABLET | Freq: Every day | ORAL | Status: DC
  Administered 2024-02-13 – 2024-02-16 (×4): 10 mg via ORAL
  Filled 2024-02-13 (×5): qty 1

## 2024-02-13 MED ORDER — ACETAMINOPHEN 325 MG PO TABS
650.0000 mg | ORAL_TABLET | Freq: Four times a day (QID) | ORAL | Status: DC | PRN
  Administered 2024-02-14: 650 mg via ORAL
  Filled 2024-02-13: qty 2

## 2024-02-13 MED ORDER — FAMOTIDINE 20 MG PO TABS: 40.0000 mg | ORAL_TABLET | Freq: Every day | ORAL | Status: DC

## 2024-02-13 NOTE — ED Triage Notes (Signed)
 Arrived by [facility] from home. C/o weakness and chest pain X1 week.   History chronic UTI's and recently finished antibiotic. Foley present on arrival.   Needs assistance ambulating  EMS vitals: 99HR 96% RA 120/62 b/p 99.4 oral 144 CBG A&O x4 per EMS

## 2024-02-13 NOTE — Sepsis Progress Note (Signed)
 Code Sepsis protocol being monitored by eLink.

## 2024-02-13 NOTE — ED Notes (Signed)
 Lab called a second time about cultures not yet collected

## 2024-02-13 NOTE — ED Notes (Signed)
 Lab called to collect blood work due to pt being difficult stick

## 2024-02-13 NOTE — Consult Note (Signed)
 CODE SEPSIS - PHARMACY COMMUNICATION  **Broad Spectrum Antibiotics should be administered within 1 hour of Sepsis diagnosis**  Time Code Sepsis Called/Page Received: 1419  Antibiotics Ordered: cefepime  Time of 1st antibiotic administration: 1454  Additional action taken by pharmacy: n/a  If necessary, Name of Provider/Nurse Contacted: n/a    Victoria Crane ,PharmD Clinical Pharmacist  02/13/2024  2:22 PM

## 2024-02-13 NOTE — H&P (Signed)
 "  History and Physical    Victoria Crane FMW:[id number] DOB: [birth year] DOA: 02/13/2024  DOS: the patient was seen and examined on 02/13/2024  PCP: Melodye Reena Prey, MD   Patient coming from: Home  I have personally briefly reviewed patient's old medical records in [facility]  Chief Complaint: Fever 4 days.  HPI: Victoria Crane is a pleasant 73 y.o. female with medical history significant for HTN, HLD, recurrent urinary tract infection, s/p indwelling Foley catheter who presented to ED complaining of left-sided abdominal pain and concern for urinary tract infection with fever.  She was recently presented to [facility]  for urinary tract infection.  She stated that she completed a course of 7 days of oral antibiotic at home.  Patient stated that after she finished antibiotic course she started having those symptoms of fever.  In the past she was positive for E. coli Proteus and Pseudomonas.  I do not see the recent urinary cultures.  Last one I see is in 2023.  She endorses some nausea but no vomiting.  She denies any cough, chest pain, palpitations.  ED Course: Upon arrival to the ED, patient is found to have soft blood pressure.  Left CVA tenderness, creatinine 2.01, BUN 49, WBC 12.6, urine analysis positive for urinary tract infection.  Bicarb was 17, lactic acid was 1.7.  She was started on cefepime due to concern for Pseudomonas in the past.  Hospitalist service was consulted for evaluation for admission for recurrent urinary tract infection and failed outpatient treatment.  Review of Systems:  ROS  All other systems negative except as noted in the HPI.  Past Medical History:  Diagnosis Date   Anemia    Arthritis    Cancer (HCC)    BREAST RIGHT-COLON CA   Complication of anesthesia    GERD (gastroesophageal reflux disease)    Hypertension    PONV (postoperative nausea and vomiting)    NAUSEA A LONG TIME AGO   Thyroid goiter     Past Surgical History:   Procedure Laterality Date   ABDOMINAL HYSTERECTOMY     BACK SURGERY     MULTIPLE BACK SURGERIES   BREAST SURGERY     COLON SURGERY     DUE TO CANCER   COLONOSCOPY     HIP SURGERY Left    JOINT REPLACEMENT Left    hip   JOINT REPLACEMENT Bilateral    knees   KNEE SURGERY Bilateral    MASTECTOMY     TOTAL HIP ARTHROPLASTY Right 09/17/2019   Procedure: TOTAL HIP ARTHROPLASTY ANTERIOR APPROACH;  Surgeon: Leora Lynwood SAUNDERS, MD;  Location: [facility] ORS;  Service: Orthopedics;  Laterality: Right;     reports that she quit smoking about 29 years ago. Her smoking use included cigarettes. She started smoking about 49 years ago. She has a 5 pack-year smoking history. She has never used smokeless tobacco. She reports that she does not currently use alcohol. She reports that she does not use drugs.  Allergies[1]  No family history on file.  Prior to Admission medications  Medication Sig Start Date End Date Taking? Authorizing Provider  cyclobenzaprine (FLEXERIL) 10 MG tablet Take 10 mg by mouth 2 (two) times daily as needed for muscle spasms. 06/05/23 06/04/24 Yes [provider]  ascorbic acid (VITAMIN C) 500 MG tablet Take 500 mg by mouth daily.    [provider]  cetirizine (ZYRTEC) 10 MG tablet Take 10 mg by mouth daily.    [provider]  diclofenac (VOLTAREN) 75 MG EC tablet Take 75 mg by mouth 2 (two) times daily. 05/03/20   [provider]  dicyclomine  (BENTYL ) 10 MG capsule Take 10 mg by mouth 3 (three) times daily as needed for spasms.  05/15/16   [provider]  ezetimibe  (ZETIA ) 10 MG tablet Take 10 mg by mouth at bedtime.    [provider]  famotidine (PEPCID) 40 MG tablet Take 1 tablet by mouth at bedtime. 01/25/15   [provider]  ferrous sulfate  325 (65 FE) MG tablet Take 325 mg by mouth daily with breakfast.    [provider]  FLUoxetine  (PROZAC ) 20 MG capsule Take 20-40 mg by mouth at bedtime.  06/13/13    [provider]  fluticasone (FLONASE) 50 MCG/ACT nasal spray Place 1-2 sprays into both nostrils daily as needed for allergies.  09/27/09   [provider]  gabapentin  (NEURONTIN ) 300 MG capsule Take 600 mg by mouth 2 (two) times daily.  05/21/13   [provider]  hydrochlorothiazide  (HYDRODIURIL ) 25 MG tablet Take 12.5 mg by mouth every morning.  05/16/13   [provider]  HYDROcodone -acetaminophen  (NORCO/VICODIN) 5-325 MG tablet Take 1 tablet by mouth every 4 (four) hours as needed for moderate pain (pain score 4-6). Patient taking differently: Take 0.5 tablets by mouth in the morning and at bedtime. 09/19/19   Joshua Lin, PA-C  losartan (COZAAR) 50 MG tablet Take 50 mg by mouth daily.    [provider]  omeprazole (PRILOSEC) 20 MG capsule Take 20 mg by mouth daily.    [provider]  ondansetron  (ZOFRAN ) 4 MG tablet Take 1 tablet (4 mg total) by mouth every 6 (six) hours as needed for nausea. 09/19/19   Joshua Lin, PA-C  ondansetron  (ZOFRAN -ODT) 8 MG disintegrating tablet Take 8 mg by mouth every 8 (eight) hours as needed for nausea or vomiting.    [provider]  oxybutynin (DITROPAN) 5 MG tablet Take 5 mg by mouth 2 (two) times daily. 07/03/20   [provider]  potassium chloride  SA (KLOR-CON ) 20 MEQ tablet Take 20 mEq by mouth daily. 07/29/19   [provider]    Physical Exam: Vitals:   02/13/24 1137 02/13/24 1139  BP: (!) 106/56   Pulse: 83   Resp: 18   Temp: 98.6 F (37 C)   TempSrc: Oral   SpO2: 99%   Weight:  90.7 kg  Height:  5' 1 (1.549 m)    Physical Exam   Constitutional: Alert, awake, calm, comfortable HEENT: Neck supple Respiratory: Clear to auscultation B/L, no wheezing, no rales.  Cardiovascular: Regular rate and rhythm, no murmurs / rubs / gallops. No extremity edema. 2+ pedal pulses. No carotid bruits.  Abdomen: Soft, no tenderness, Bowel sounds positive.  Foley catheter in  place Musculoskeletal: no clubbing / cyanosis. Good ROM, no contractures. Normal muscle tone.  Skin: no rashes, lesions, ulcers. Neurologic: CN 2-12 grossly intact. Sensation intact, No focal deficit identified Psychiatric: Alert and oriented x 3. Normal mood.    Labs on Admission: I have personally reviewed following labs and imaging studies  CBC: Recent Labs  Lab 02/13/24 1141  WBC 12.6*  HGB 12.5  HCT 38.2  MCV 92.7  PLT 324   Basic Metabolic Panel: Recent Labs  Lab 02/13/24 1141  NA 134*  K 4.7  CL 102  CO2 17*  GLUCOSE 131*  BUN 49*  CREATININE 2.01*  CALCIUM 9.6   GFR: Estimated Creatinine Clearance:  26 mL/min (A) (by C-G formula based on SCr of 2.01 mg/dL (H)). Liver Function Tests: Recent Labs  Lab 02/13/24 1141  AST 16  ALT <5  ALKPHOS 108  BILITOT 0.6  PROT 8.4*  ALBUMIN  3.8   Recent Labs  Lab 02/13/24 1141  LIPASE 13   No results for input(s): AMMONIA in the last 168 hours. Coagulation Profile: No results for input(s): INR, PROTIME in the last 168 hours. Cardiac Enzymes: No results for input(s): CKTOTAL, CKMB, CKMBINDEX, TROPONINI, TROPONINIHS in the last 168 hours. BNP (last 3 results) No results for input(s): BNP in the last 8760 hours. HbA1C: No results for input(s): HGBA1C in the last 72 hours. CBG: No results for input(s): GLUCAP in the last 168 hours. Lipid Profile: No results for input(s): CHOL, HDL, LDLCALC, TRIG, CHOLHDL, LDLDIRECT in the last 72 hours. Thyroid Function Tests: No results for input(s): TSH, T4TOTAL, FREET4, T3FREE, THYROIDAB in the last 72 hours. Anemia Panel: No results for input(s): VITAMINB12, FOLATE, FERRITIN, TIBC, IRON, RETICCTPCT in the last 72 hours. Urine analysis:    Component Value Date/Time   COLORURINE YELLOW (A) 02/13/2024 1141   APPEARANCEUR TURBID (A) 02/13/2024 1141   LABSPEC 1.013 02/13/2024 1141   PHURINE 5.0 02/13/2024 1141   GLUCOSEU  NEGATIVE 02/13/2024 1141   HGBUR MODERATE (A) 02/13/2024 1141   BILIRUBINUR NEGATIVE 02/13/2024 1141   KETONESUR NEGATIVE 02/13/2024 1141   PROTEINUR 100 (A) 02/13/2024 1141   NITRITE POSITIVE (A) 02/13/2024 1141   LEUKOCYTESUR MODERATE (A) 02/13/2024 1141    Radiological Exams on Admission: I have personally reviewed images No results found.  EKG: N/A    Assessment/Plan Principal Problem:   UTI (urinary tract infection) Active Problems:   HTN (hypertension)   HLD (hyperlipidemia)   GERD (gastroesophageal reflux disease)    Assessment and Plan: 73 year old female with history of HTN, HLD, GERD, recurrent urinary tract infection recently on Foley catheter came in for urinary tract infection failed outpatient treatment.  1.  Urinary tract infection, failed outpatient treatment - She will be placed in observation in the medical surgical unit. - She had a history of Pseudomonas in the past. - She will be continued on cefepime that was started in the emergency room. - Follow-up cultures. - If she remains symptomatic, cultures grew complicated bacteria, may consider calling ID.  2.  HTN/HLD - Blood pressure is stable - Resume her home medications losartan and Zetia  - Continue to monitor blood pressure  3.  History of neuropathy - Continue on gabapentin  at home dose of 600 twice daily  4.  GERD - Continue Protonix   5.  Depression - Resume her home dose of fluoxetine      DVT prophylaxis: Lovenox Code Status: Full Code Family Communication: None Disposition Plan: Home Consults called: None Admission status: Observation, Med-Surg   Nena Rebel, MD [facility] 02/13/2024, 5:15 PM        [1]  Allergies Allergen Reactions   Augmentin [Amoxicillin-Pot Clavulanate] Itching   Codeine Other (See Comments)    unknown    Darvon [Propoxyphene] Nausea And Vomiting   Lipitor [Atorvastatin] Other (See Comments)    Leg cramps   Niacin And Related  Hypertension   Reglan [Metoclopramide] Other (See Comments)    Gi upset   Zocor [Simvastatin] Other (See Comments)    Leg cramps   Ciprofloxacin  Rash   Indocin [Indomethacin] Rash   Iodinated Contrast Media Rash   Sulfa Antibiotics Rash   "

## 2024-02-13 NOTE — ED Notes (Signed)
 Lab at bedside

## 2024-02-13 NOTE — ED Triage Notes (Signed)
 Refer to first nurse note. Pt reports generalized body aches, weakness, abd pain, and fevers. Reports recurrent UTI's. Pt A&Ox4.

## 2024-02-13 NOTE — ED Provider Notes (Signed)
 "  [facility] Provider Note    Event Date/Time   First MD Initiated Contact with Patient 02/13/24 1317     (approximate)   History   Fever   HPI  Victoria Crane is a 73 y.o. female past medical history significant for hypertension, hyperlipidemia, recurrent urinary tract infections, who presents to the emergency department for left-sided abdominal pain and concern for urinary tract infection with fever.  Patient states that she has had recurrent episodes of urinary tract infections.  Completed a course of antibiotics for 7 days.  States that after 7 days on day 7 she started having a fever.  Fever over the past 4 days.  Endorses nausea but episodes of vomiting.  Does have some mild pain to her left kidney.  States that she has a history of pyelonephritis.  States that if she has been indwelling Foley catheter in place as well.  History of back surgeries with decreased sensation chronically to her vaginal area.  No new weakness or numbness.     Physical Exam   Triage Vital Signs: ED Triage Vitals  Encounter Vitals Group     BP 02/13/24 1137 (!) 106/56     Girls Systolic BP Percentile --      Girls Diastolic BP Percentile --      Boys Systolic BP Percentile --      Boys Diastolic BP Percentile --      Pulse Rate 02/13/24 1137 83     Resp 02/13/24 1137 18     Temp 02/13/24 1137 98.6 F (37 C)     Temp Source 02/13/24 1137 Oral     SpO2 02/13/24 1137 99 %     Weight 02/13/24 1139 200 lb (90.7 kg)     Height 02/13/24 1139 5' 1 (1.549 m)     Head Circumference --      Peak Flow --      Pain Score 02/13/24 1139 7     Pain Loc --      Pain Education --      Exclude from Growth Chart --     Most recent vital signs: Vitals:   02/13/24 1137  BP: (!) 106/56  Pulse: 83  Resp: 18  Temp: 98.6 F (37 C)  SpO2: 99%    Physical Exam Constitutional:      Appearance: She is well-developed.  HENT:     Head: Atraumatic.     Mouth/Throat:      Mouth: Mucous membranes are dry.  Eyes:     Extraocular Movements: Extraocular movements intact.     Conjunctiva/sclera: Conjunctivae normal.     Pupils: Pupils are equal, round, and reactive to light.  Cardiovascular:     Rate and Rhythm: Regular rhythm.  Pulmonary:     Effort: No respiratory distress.  Abdominal:     General: There is no distension.     Tenderness: There is left CVA tenderness. There is no right CVA tenderness.     Comments: Foley catheter in place  Musculoskeletal:        General: Normal range of motion.     Cervical back: Normal range of motion.  Skin:    General: Skin is warm.  Neurological:     Mental Status: She is alert. Mental status is at baseline.     IMPRESSION / MDM / ASSESSMENT AND PLAN / ED COURSE  I reviewed the triage vital signs and the nursing notes.  On arrival afebrile, blood pressure  106/56, no tachycardia  Differential diagnosis including complicated urinary tract infection, pyelonephritis, sepsis, dehydration, electrolyte abnormality  Will exchange her Foley catheter    No tachycardic or bradycardic dysrhythmias while on cardiac telemetry.   LABS (all labs ordered are listed, but only abnormal results are displayed) Labs interpreted as -    Labs Reviewed  COMPREHENSIVE METABOLIC PANEL WITH GFR - Abnormal; Notable for the following components:      Result Value   Sodium 134 (*)    CO2 17 (*)    Glucose, Bld 131 (*)    BUN 49 (*)    Creatinine, Ser 2.01 (*)    Total Protein 8.4 (*)    GFR, Estimated 26 (*)    All other components within normal limits  CBC - Abnormal; Notable for the following components:   WBC 12.6 (*)    All other components within normal limits  URINALYSIS, ROUTINE W REFLEX MICROSCOPIC - Abnormal; Notable for the following components:   Color, Urine YELLOW (*)    APPearance TURBID (*)    Hgb urine dipstick MODERATE (*)    Protein, ur 100 (*)    Nitrite POSITIVE (*)    Leukocytes,Ua MODERATE (*)     Bacteria, UA MANY (*)    All other components within normal limits  CULTURE, BLOOD (ROUTINE X 2)  CULTURE, BLOOD (ROUTINE X 2)  LIPASE, BLOOD  LACTIC ACID, PLASMA  LACTIC ACID, PLASMA     MDM  Chart review patient has a history of Pseudomonas that has been pansensitive.  Leukocytosis and to 12.6, CO2 is low at 17.  Mild hyponatremia at 134.  Mild increase of her creatinine to 2.1 from a baseline of 1.8.  Mildly elevated BUN at 49.  UA concerning for urinary tract infection with positive nitrites and leukocyte esterase and WBCs.  Concern for complicated urinary tract infection and Pseudomonas.  Has failed outpatient treatment.  Adding on blood cultures and a lactic acid.  Given a bolus of IV fluids.  Started on IV cefepime.  Will consult hospitalist for admission for complicated urinary tract infection and concern for pyelonephritis.     PROCEDURES:  Critical Care performed: yes  .Critical Care  Performed by: Suzanne Kirsch, MD Authorized by: Suzanne Kirsch, MD   Critical care provider statement:    Critical care time (minutes):  30   Critical care was time spent personally by me on the following activities:  Development of treatment plan with patient or surrogate, discussions with consultants, evaluation of patient's response to treatment, examination of patient, ordering and review of laboratory studies, ordering and review of radiographic studies, ordering and performing treatments and interventions, pulse oximetry, re-evaluation of patient's condition and review of old charts   Patient's presentation is most consistent with acute presentation with potential threat to life or bodily function.   MEDICATIONS ORDERED IN ED: Medications  ceFEPIme (MAXIPIME) 2 g in sodium chloride  0.9 % 100 mL IVPB (has no administration in time range)  ceFEPIme (MAXIPIME) 2 g in sodium chloride  0.9 % 100 mL IVPB (has no administration in time range)  lactated ringers  bolus 1,000 mL (has no  administration in time range)  ondansetron  (ZOFRAN ) injection 4 mg (has no administration in time range)  lactated ringers  infusion (has no administration in time range)    FINAL CLINICAL IMPRESSION(S) / ED DIAGNOSES   Final diagnoses:  Complicated urinary tract infection  Pyelonephritis     Rx / DC Orders   ED Discharge Orders  None        Note:  This document was prepared using Dragon voice recognition software and may include unintentional dictation errors.   Suzanne Kirsch, MD 02/13/24 1421  "

## 2024-02-14 DIAGNOSIS — N390 Urinary tract infection, site not specified: Principal | ICD-10-CM

## 2024-02-14 LAB — COMPREHENSIVE METABOLIC PANEL WITH GFR
ALT: <5 U/L (ref 0–44)
AST: 12 U/L — ABNORMAL LOW (ref 15–41)
Albumin: 3.2 g/dL — ABNORMAL LOW (ref 3.5–5.0)
Alkaline Phosphatase: 87 U/L (ref 38–126)
Anion gap: 11 (ref 5–15)
BUN: 39 mg/dL — ABNORMAL HIGH (ref 8–23)
CO2: 20 mmol/L — ABNORMAL LOW (ref 22–32)
Calcium: 8.7 mg/dL — ABNORMAL LOW (ref 8.9–10.3)
Chloride: 105 mmol/L (ref 98–111)
Creatinine, Ser: 1.56 mg/dL — ABNORMAL HIGH (ref 0.44–1.00)
GFR, Estimated: 35 mL/min — ABNORMAL LOW
Glucose, Bld: 109 mg/dL — ABNORMAL HIGH (ref 70–99)
Potassium: 4.6 mmol/L (ref 3.5–5.1)
Sodium: 136 mmol/L (ref 135–145)
Total Bilirubin: 0.5 mg/dL (ref 0.0–1.2)
Total Protein: 6.8 g/dL (ref 6.5–8.1)

## 2024-02-14 LAB — URINALYSIS, W/ REFLEX TO CULTURE (INFECTION SUSPECTED)
Bilirubin Urine: NEGATIVE
Glucose, UA: NEGATIVE mg/dL
Ketones, ur: NEGATIVE mg/dL
Nitrite: NEGATIVE
Protein, ur: NEGATIVE mg/dL
Specific Gravity, Urine: 1.009 (ref 1.005–1.030)
Squamous Epithelial / HPF: 0 /HPF (ref 0–5)
pH: 6 (ref 5.0–8.0)

## 2024-02-14 LAB — PROTIME-INR
INR: 1.4 — ABNORMAL HIGH (ref 0.8–1.2)
Prothrombin Time: 17.9 s — ABNORMAL HIGH (ref 11.4–15.2)

## 2024-02-14 LAB — CBC
HCT: 31 % — ABNORMAL LOW (ref 36.0–46.0)
Hemoglobin: 10.2 g/dL — ABNORMAL LOW (ref 12.0–15.0)
MCH: 30.4 pg (ref 26.0–34.0)
MCHC: 32.9 g/dL (ref 30.0–36.0)
MCV: 92.5 fL (ref 80.0–100.0)
Platelets: 262 K/uL (ref 150–400)
RBC: 3.35 MIL/uL — ABNORMAL LOW (ref 3.87–5.11)
RDW: 12.8 % (ref 11.5–15.5)
WBC: 14 K/uL — ABNORMAL HIGH (ref 4.0–10.5)
nRBC: 0 % (ref 0.0–0.2)

## 2024-02-14 MED ORDER — MAGIC MOUTHWASH
5.0000 mL | Freq: Three times a day (TID) | ORAL | Status: DC | PRN
  Administered 2024-02-15: 5 mL via ORAL
  Filled 2024-02-14 (×2): qty 5

## 2024-02-14 MED ORDER — CHLORHEXIDINE GLUCONATE CLOTH 2 % EX PADS
6.0000 | MEDICATED_PAD | Freq: Every day | CUTANEOUS | Status: DC
  Administered 2024-02-15 – 2024-02-17 (×3): 6 via TOPICAL

## 2024-02-14 NOTE — Progress Notes (Signed)
 " Progress Note   Patient: Victoria Crane FMW:[id number] DOB: 03-22-1950 DOA: 02/13/2024     1 DOS: the patient was seen and examined on 02/14/2024   Brief hospital course: From HPI Victoria Crane is a pleasant 74 y.o. female with medical history significant for HTN, HLD, recurrent urinary tract infection, s/p indwelling Foley catheter who presented to ED complaining of left-sided abdominal pain and concern for urinary tract infection with fever.  She was recently presented to [facility]  for urinary tract infection.  She stated that she completed a course of 7 days of oral antibiotic at home.  Patient stated that after she finished antibiotic course she started having those symptoms of fever.  In the past she was positive for E. coli Proteus and Pseudomonas.  I do not see the recent urinary cultures.  Last one I see is in 2023.  She endorses some nausea but no vomiting.  She denies any cough, chest pain, palpitations.   ED Course: Upon arrival to the ED, patient is found to have soft blood pressure.  Left CVA tenderness, creatinine 2.01, BUN 49, WBC 12.6, urine analysis positive for urinary tract infection.  Bicarb was 17, lactic acid was 1.7.  She was started on cefepime due to concern for Pseudomonas in the past.    Assessment and Plan:   1.  Urinary tract infection, failed outpatient treatment - She will be placed in observation in the medical surgical unit. - She had a history of Pseudomonas in the past. - Follow-up cultures. Continue current antibiotics - If she remains symptomatic, cultures grew complicated bacteria, may consider calling ID.   2.  HTN/HLD - Blood pressure is stable - Resume her home medications losartan and Zetia  - Continue to monitor blood pressure   3.  History of neuropathy - Continue on gabapentin  at home dose of 600 twice daily   4.  GERD - Continue Protonix    5.  Depression - Resume her home dose of fluoxetine    DVT prophylaxis: Lovenox Code  Status: Full Code Family Communication: None Disposition Plan: Home Consults called: None   Subjective:  Patient seen and examined at bedside this morning She admits to improvement Had a temperature spike up to 100.4 overnight  Physical Exam:  Constitutional: Alert, awake, calm, comfortable HEENT: Neck supple Respiratory: Clear to auscultation B/L, no wheezing, no rales.  Cardiovascular: Regular rate and rhythm, no murmurs / rubs / gallops. No extremity edema. 2+ pedal pulses. No carotid bruits.  Abdomen: Soft, no tenderness, Bowel sounds positive.  Foley catheter in place Musculoskeletal: no clubbing / cyanosis. Good ROM, no contractures. Normal muscle tone.  Skin: no rashes, lesions, ulcers. Neurologic: CN 2-12 grossly intact. Sensation intact, No focal deficit identified Psychiatric: Alert and oriented x 3. Normal mood.    Data Reviewed:    Latest Ref Rng & Units 02/14/2024    4:49 AM 02/13/2024   10:37 PM 02/13/2024   11:41 AM  CBC  WBC 4.0 - 10.5 K/uL 14.0  15.8  12.6   Hemoglobin 12.0 - 15.0 g/dL 89.7  89.3  87.4   Hematocrit 36.0 - 46.0 % 31.0  32.4  38.2   Platelets 150 - 400 K/uL 262  283  324        Latest Ref Rng & Units 02/14/2024    4:49 AM 02/13/2024   10:37 PM 02/13/2024   11:41 AM  BMP  Glucose 70 - 99 mg/dL 890   868   BUN 8 - 23  mg/dL 39   49   Creatinine 9.55 - 1.00 mg/dL 8.43  8.33  7.98   Sodium 135 - 145 mmol/L 136   134   Potassium 3.5 - 5.1 mmol/L 4.6   4.7   Chloride 98 - 111 mmol/L 105   102   CO2 22 - 32 mmol/L 20   17   Calcium 8.9 - 10.3 mg/dL 8.7   9.6      Vitals:   02/13/24 1839 02/13/24 2001 02/14/24 0353 02/14/24 0755  BP: (!) 124/55 (!) 117/57 (!) 107/52 (!) 109/56  Pulse: 91 90 96 81  Resp: 15 16 18 16   Temp: 99.9 F (37.7 C) 99.4 F (37.4 C) (!) 100.4 F (38 C) 99.8 F (37.7 C)  TempSrc:  Oral  Oral  SpO2: 93% 100% 95% 95%  Weight:      Height:          Author: Drue ONEIDA Potter, MD 02/14/2024 1:35 PM  For on call  review [email or web address].  "

## 2024-02-14 NOTE — Plan of Care (Signed)
  Problem: Clinical Measurements: Goal: Ability to maintain clinical measurements within normal limits will improve Outcome: Progressing   Problem: Pain Managment: Goal: General experience of comfort will improve and/or be controlled Outcome: Progressing   Problem: Safety: Goal: Ability to remain free from injury will improve Outcome: Progressing   Problem: Skin Integrity: Goal: Risk for impaired skin integrity will decrease Outcome: Progressing

## 2024-02-15 DIAGNOSIS — N390 Urinary tract infection, site not specified: Secondary | ICD-10-CM | POA: Diagnosis not present

## 2024-02-15 MED ORDER — GABAPENTIN 300 MG PO CAPS
300.0000 mg | ORAL_CAPSULE | Freq: Two times a day (BID) | ORAL | Status: DC
  Administered 2024-02-15 – 2024-02-17 (×4): 300 mg via ORAL
  Filled 2024-02-15 (×4): qty 1

## 2024-02-15 MED ORDER — SODIUM CHLORIDE 0.9 % IV SOLN
2.0000 g | Freq: Two times a day (BID) | INTRAVENOUS | Status: AC
  Administered 2024-02-15 – 2024-02-17 (×4): 2 g via INTRAVENOUS
  Filled 2024-02-15 (×4): qty 12.5

## 2024-02-15 NOTE — TOC CM/SW Note (Signed)
 Transition of Care [facility]) CM/SW Note    Transition of Care [facility]) - Inpatient Brief Assessment   Patient Details  Name: Victoria Crane MRN: [id number] Date of Birth: 02/02/1951  Transition of Care [facility]) CM/SW Contact:    Alfonso Rummer, LCSW Phone Number: 02/15/2024, 3:55 PM   Clinical Narrative: Completed toc chart review no toc needs identified please contact should needs arise.   Transition of Care Asessment: Insurance and Status: Insurance coverage has been reviewed Patient has primary care physician: Yes (RUBIN, SHARON SUSON) Home environment has been reviewed: single family room   Prior/Current Home Services: Current home services (active with [facility]) Social Drivers of Health Review: SDOH reviewed no interventions necessary   Transition of care needs: no transition of care needs at this time

## 2024-02-15 NOTE — Care Management Important Message (Signed)
 Important Message  Patient Details  Name: Victoria Crane MRN: [id number] Date of Birth: 01-Feb-1951   Important Message Given:  Yes - Medicare IM     [name] 02/15/2024, 12:37 PM

## 2024-02-15 NOTE — Progress Notes (Signed)
 " Progress Note   Patient: Victoria Crane FMW:[id number] DOB: 12-Mar-1950 DOA: 02/13/2024     2 DOS: the patient was seen and examined on 02/15/2024     Brief hospital course: From HPI Victoria Crane is a pleasant 74 y.o. female with medical history significant for HTN, HLD, recurrent urinary tract infection, s/p indwelling Foley catheter who presented to ED complaining of left-sided abdominal pain and concern for urinary tract infection with fever.  She was recently presented to [facility]  for urinary tract infection.  She stated that she completed a course of 7 days of oral antibiotic at home.  Patient stated that after she finished antibiotic course she started having those symptoms of fever.  In the past she was positive for E. coli Proteus and Pseudomonas.  I do not see the recent urinary cultures.  Last one I see is in 2023.  She endorses some nausea but no vomiting.  She denies any cough, chest pain, palpitations.   ED Course: Upon arrival to the ED, patient is found to have soft blood pressure.  Left CVA tenderness, creatinine 2.01, BUN 49, WBC 12.6, urine analysis positive for urinary tract infection.  Bicarb was 17, lactic acid was 1.7.  She was started on cefepime due to concern for Pseudomonas in the past.     Assessment and Plan:    1.  Urinary tract infection, failed outpatient treatment - She will be placed in observation in the medical surgical unit. - She had a history of Pseudomonas in the past. - Follow-up cultures Continue current antibiotics - Final culture results pending   2.  HTN/HLD - Blood pressure is stable - Resume her home medications losartan and Zetia  - Continue to monitor blood pressure   3.  History of neuropathy - Continue on gabapentin  at home dose of 600 twice daily   4.  GERD - Continue Protonix    5.  Depression - Resume her home dose of fluoxetine    DVT prophylaxis: Lovenox Code Status: Full Code Family Communication:  None Disposition Plan: Home Consults called: None     Subjective:  Patient seen and examined at bedside this morning Temperature spike improved She denies any acute overnight complaints   Physical Exam:   Constitutional: Alert, awake, calm, comfortable HEENT: Neck supple Respiratory: Clear to auscultation B/L, no wheezing, no rales.  Cardiovascular: Regular rate and rhythm, no murmurs / rubs / gallops. No extremity edema. 2+ pedal pulses. No carotid bruits.  Abdomen: Soft, no tenderness, Bowel sounds positive.  Foley catheter in place Musculoskeletal: no clubbing / cyanosis. Good ROM, no contractures. Normal muscle tone.  Skin: no rashes, lesions, ulcers. Neurologic: CN 2-12 grossly intact. Sensation intact, No focal deficit identified Psychiatric: Alert and oriented x 3. Normal mood.     Data Reviewed:  Vitals:   02/15/24 0321 02/15/24 0502 02/15/24 0559 02/15/24 0819  BP: (!) 97/56 (!) 115/58 (!) 119/57 (!) 102/51  Pulse: 87 86 86 79  Resp: 16 16  16   Temp: 98 F (36.7 C) 99.4 F (37.4 C)  98.5 F (36.9 C)  TempSrc:  Oral    SpO2: 91% 93% 93% 96%  Weight:      Height:          Latest Ref Rng & Units 02/14/2024    4:49 AM 02/13/2024   10:37 PM 02/13/2024   11:41 AM  CBC  WBC 4.0 - 10.5 K/uL 14.0  15.8  12.6   Hemoglobin 12.0 - 15.0 g/dL 10.2  10.6  12.5   Hematocrit 36.0 - 46.0 % 31.0  32.4  38.2   Platelets 150 - 400 K/uL 262  283  324        Latest Ref Rng & Units 02/14/2024    4:49 AM 02/13/2024   10:37 PM 02/13/2024   11:41 AM  BMP  Glucose 70 - 99 mg/dL 890   868   BUN 8 - 23 mg/dL 39   49   Creatinine 9.55 - 1.00 mg/dL 8.43  8.33  7.98   Sodium 135 - 145 mmol/L 136   134   Potassium 3.5 - 5.1 mmol/L 4.6   4.7   Chloride 98 - 111 mmol/L 105   102   CO2 22 - 32 mmol/L 20   17   Calcium 8.9 - 10.3 mg/dL 8.7   9.6      Author: Drue ONEIDA Potter, MD 02/15/2024 1:08 PM  For on call review [email or web address]  "

## 2024-02-16 DIAGNOSIS — N390 Urinary tract infection, site not specified: Secondary | ICD-10-CM | POA: Diagnosis not present

## 2024-02-16 LAB — CBC WITH DIFFERENTIAL/PLATELET
Abs Immature Granulocytes: 0.06 K/uL (ref 0.00–0.07)
Basophils Absolute: 0 K/uL (ref 0.0–0.1)
Basophils Relative: 0 %
Eosinophils Absolute: 0.3 K/uL (ref 0.0–0.5)
Eosinophils Relative: 4 %
HCT: 31 % — ABNORMAL LOW (ref 36.0–46.0)
Hemoglobin: 9.8 g/dL — ABNORMAL LOW (ref 12.0–15.0)
Immature Granulocytes: 1 %
Lymphocytes Relative: 25 %
Lymphs Abs: 1.9 K/uL (ref 0.7–4.0)
MCH: 29.9 pg (ref 26.0–34.0)
MCHC: 31.6 g/dL (ref 30.0–36.0)
MCV: 94.5 fL (ref 80.0–100.0)
Monocytes Absolute: 0.7 K/uL (ref 0.1–1.0)
Monocytes Relative: 9 %
Neutro Abs: 4.8 K/uL (ref 1.7–7.7)
Neutrophils Relative %: 61 %
Platelets: 267 K/uL (ref 150–400)
RBC: 3.28 MIL/uL — ABNORMAL LOW (ref 3.87–5.11)
RDW: 12.6 % (ref 11.5–15.5)
WBC: 7.8 K/uL (ref 4.0–10.5)
nRBC: 0 % (ref 0.0–0.2)

## 2024-02-16 LAB — BASIC METABOLIC PANEL WITH GFR
Anion gap: 8 (ref 5–15)
BUN: 21 mg/dL (ref 8–23)
CO2: 25 mmol/L (ref 22–32)
Calcium: 8.6 mg/dL — ABNORMAL LOW (ref 8.9–10.3)
Chloride: 106 mmol/L (ref 98–111)
Creatinine, Ser: 1.32 mg/dL — ABNORMAL HIGH (ref 0.44–1.00)
GFR, Estimated: 43 mL/min — ABNORMAL LOW
Glucose, Bld: 116 mg/dL — ABNORMAL HIGH (ref 70–99)
Potassium: 4.4 mmol/L (ref 3.5–5.1)
Sodium: 139 mmol/L (ref 135–145)

## 2024-02-16 NOTE — Consult Note (Signed)
 WOC Nurse Consult Note: Reason for Consult: Buttocks wound  Wound type: FT wound, unclear etiology; appear to be track or tunnel and chronic  Pressure Injury POA: NA Measurement:  see nursing flow sheets Wound bed: unable to assess Drainage (amount, consistency, odor) see nursing flow sheets; reported to be purulent  Periwound: intact  Dressing procedure/placement/frequency: Flush wound with saline Pack with 1/4 or 1/2 iodoform gauze and top with foam based on size of opening  Lawson # [id number] 1/4 or Gerlean # [phone] 1/2   Re consult if needed, will not follow at this time. Thanks  [employer], RN,CWOCN, CNS, [employer] [phone]

## 2024-02-16 NOTE — Progress Notes (Signed)
 " Progress Note   Patient: Victoria Crane FMW:[id number] DOB: Nov 12, 1950 DOA: 02/13/2024     3 DOS: the patient was seen and examined on 02/16/2024     Brief hospital course: From HPI Victoria Crane is a pleasant 74 y.o. female with medical history significant for HTN, HLD, recurrent urinary tract infection, s/p indwelling Foley catheter who presented to ED complaining of left-sided abdominal pain and concern for urinary tract infection with fever.  She was recently presented to [facility]  for urinary tract infection.  She stated that she completed a course of 7 days of oral antibiotic at home.  Patient stated that after she finished antibiotic course she started having those symptoms of fever.  In the past she was positive for E. coli Proteus and Pseudomonas.  I do not see the recent urinary cultures.  Last one I see is in 2023.  She endorses some nausea but no vomiting.  She denies any cough, chest pain, palpitations.   ED Course: Upon arrival to the ED, patient is found to have soft blood pressure.  Left CVA tenderness, creatinine 2.01, BUN 49, WBC 12.6, urine analysis positive for urinary tract infection.  Bicarb was 17, lactic acid was 1.7.  She was started on cefepime  due to concern for Pseudomonas in the past.     Assessment and Plan:    1.  Urinary tract infection, failed outpatient treatment - She will be placed in observation in the medical surgical unit. - She had a history of Pseudomonas in the past. - Follow-up cultures Continue current antibiotics - Final culture results pending Have discussed with infectious disease and we will continue to wait for final culture results by tomorrow   2.  HTN/HLD - Blood pressure is stable Continue home medications losartan  and Zetia  - Continue to monitor blood pressure   3.  History of neuropathy - Continue on gabapentin  at home dose of 600 twice daily   4.  GERD - Continue Protonix    5.  Depression - Resume her home dose of  fluoxetine   6.  Left buttocks wound Wound care consulted   DVT prophylaxis: Lovenox  Code Status: Full Code Family Communication: None Disposition Plan: Home Consults called: None     Subjective:  Patient seen and examined at bedside this morning No more temperature spike Denies nausea vomiting or abdominal pain   Physical Exam:   Constitutional: Alert, awake, calm, comfortable HEENT: Neck supple Respiratory: Clear to auscultation B/L, no wheezing, no rales.  Cardiovascular: Regular rate and rhythm, no murmurs / rubs / gallops. No extremity edema. 2+ pedal pulses. No carotid bruits.  Abdomen: Soft, no tenderness, Bowel sounds positive.  Foley catheter in place Musculoskeletal: no clubbing / cyanosis. Good ROM, no contractures. Normal muscle tone.  Skin: No wound noted to the buttocks area Neurologic: CN 2-12 grossly intact. Sensation intact, No focal deficit identified Psychiatric: Alert and oriented x 3. Normal mood.     Data Reviewed:      Latest Ref Rng & Units 02/16/2024    4:10 AM 02/14/2024    4:49 AM 02/13/2024   10:37 PM  CBC  WBC 4.0 - 10.5 K/uL 7.8  14.0  15.8   Hemoglobin 12.0 - 15.0 g/dL 9.8  89.7  89.3   Hematocrit 36.0 - 46.0 % 31.0  31.0  32.4   Platelets 150 - 400 K/uL 267  262  283        Latest Ref Rng & Units 02/16/2024    4:10  AM 02/14/2024    4:49 AM 02/13/2024   10:37 PM  BMP  Glucose 70 - 99 mg/dL 883  890    BUN 8 - 23 mg/dL 21  39    Creatinine 9.55 - 1.00 mg/dL 8.67  8.43  8.33   Sodium 135 - 145 mmol/L 139  136    Potassium 3.5 - 5.1 mmol/L 4.4  4.6    Chloride 98 - 111 mmol/L 106  105    CO2 22 - 32 mmol/L 25  20    Calcium 8.9 - 10.3 mg/dL 8.6  8.7        Vitals:   02/15/24 1659 02/15/24 2131 02/16/24 0414 02/16/24 0742  BP: 109/63 109/62 114/66 127/62  Pulse: 71 72 75 74  Resp: 16 16 18 16   Temp: 98.2 F (36.8 C) 98.9 F (37.2 C) 98.7 F (37.1 C) 98.7 F (37.1 C)  TempSrc:  Oral Oral Oral  SpO2: 98% 97% 96% 100%  Weight:       Height:         Author: Drue ONEIDA Potter, MD 02/16/2024 2:26 PM  For on call review [email or web address]  "

## 2024-02-16 NOTE — Plan of Care (Signed)

## 2024-02-17 DIAGNOSIS — N390 Urinary tract infection, site not specified: Secondary | ICD-10-CM | POA: Diagnosis present

## 2024-02-17 LAB — URINE CULTURE: Culture: 10000 — AB

## 2024-02-17 MED ORDER — ACETAMINOPHEN 325 MG PO TABS: 650.0000 mg | ORAL_TABLET | Freq: Four times a day (QID) | ORAL | 0 refills | Status: AC | PRN

## 2024-02-17 MED ORDER — SODIUM CHLORIDE 0.9 % IV SOLN
2.0000 g | Freq: Two times a day (BID) | INTRAVENOUS | Status: DC
  Administered 2024-02-17: 2 g via INTRAVENOUS
  Filled 2024-02-17 (×2): qty 12.5

## 2024-02-17 MED ORDER — LEVOFLOXACIN 750 MG PO TABS: 750.0000 mg | ORAL_TABLET | ORAL | 0 refills | Status: AC

## 2024-02-17 NOTE — Plan of Care (Signed)
 IV removed, discharge instructions reviewed, patient discharged to home with friend

## 2024-02-17 NOTE — Discharge Summary (Signed)
 " Physician Discharge Summary   Patient: Victoria Crane MRN: [id number] DOB: Jan 23, 1951  Admit date:     02/13/2024  Discharge date: 02/17/2024  Discharge Physician: Drue ONEIDA Potter   PCP: Melodye Reena Prey, MD   Recommendations at discharge:  Follow-up with your outpatient infectious disease  Discharge Diagnoses: Principal Problem:   UTI (urinary tract infection) Active Problems:   HTN (hypertension)   HLD (hyperlipidemia)   GERD (gastroesophageal reflux disease)  Resolved Problems:   * No resolved hospital problems. [facility] Hospital Course: From HPI [name] is a pleasant 74 y.o. female with medical history significant for HTN, HLD, recurrent urinary tract infection, s/p indwelling Foley catheter who presented to ED complaining of left-sided abdominal pain and concern for urinary tract infection with fever.  She was recently presented to [facility]  for urinary tract infection.  She stated that she completed a course of 7 days of oral antibiotic at home.  Patient stated that after she finished antibiotic course she started having those symptoms of fever.  In the past she was positive for E. coli Proteus and Pseudomonas.  I do not see the recent urinary cultures.  Last one I see is in 2023.  She endorses some nausea but no vomiting.  She denies any cough, chest pain, palpitations.   ED Course: Upon arrival to the ED, patient is found to have soft blood pressure.  Left CVA tenderness, creatinine 2.01, BUN 49, WBC 12.6, urine analysis positive for urinary tract infection.  Bicarb was 17, lactic acid was 1.7.  She was started on cefepime  due to concern for Pseudomonas in the past.    Assessment and Plan:    1.  Urinary tract infection, failed outpatient treatment - She will be placed in observation in the medical surgical unit. - She had a history of Pseudomonas in the past. Urine culture result showing Enterococcus as well as Klebsiella Continue current antibiotics - Final  culture results pending Have discussed with infectious disease with recommendation to continue on levofloxacin  for 2 more doses.    2.  HTN/HLD - Blood pressure is stable Continue home medications losartan  and Zetia  - Continue to monitor blood pressure   3.  History of neuropathy Continue gabapentin    4.  GERD - Continue Protonix    5.  Depression - Resume her home dose of fluoxetine    6.  Left buttocks wound Continue wound care Follow-up with outpatient ID  Consultants: Infectious disease Procedures performed: None Disposition: Home Diet recommendation:  Cardiac diet DISCHARGE MEDICATION: Allergies as of 02/17/2024       Reactions   Augmentin [amoxicillin-pot Clavulanate] Itching   Codeine Other (See Comments)   unknown   Darvon [propoxyphene] Nausea And Vomiting   Lipitor [atorvastatin] Other (See Comments)   Leg cramps   Niacin And Related Hypertension   Reglan [metoclopramide] Other (See Comments)   Gi upset   Zocor [simvastatin] Other (See Comments)   Leg cramps   Ciprofloxacin  Rash   Indocin [indomethacin] Rash   Iodinated Contrast Media Rash   Sulfa Antibiotics Rash        Medication List     STOP taking these medications    diclofenac 75 MG EC tablet Commonly known as: VOLTAREN   hydrochlorothiazide  25 MG tablet Commonly known as: HYDRODIURIL        TAKE these medications    acetaminophen  325 MG tablet Commonly known as: TYLENOL  Take 2 tablets (650 mg total) by mouth every 6 (six) hours as needed  for mild pain (pain score 1-3) or fever (or Fever >/= 101).   ascorbic acid 500 MG tablet Commonly known as: VITAMIN C Take 500 mg by mouth daily.   cetirizine 10 MG tablet Commonly known as: ZYRTEC Take 10 mg by mouth daily.   cyclobenzaprine 10 MG tablet Commonly known as: FLEXERIL Take 10 mg by mouth 2 (two) times daily as needed for muscle spasms.   dicyclomine  10 MG capsule Commonly known as: BENTYL  Take 10 mg by mouth 3 (three)  times daily as needed for spasms.   ezetimibe  10 MG tablet Commonly known as: ZETIA  Take 10 mg by mouth at bedtime.   famotidine  40 MG tablet Commonly known as: PEPCID  Take 1 tablet by mouth at bedtime.   ferrous sulfate  325 (65 FE) MG tablet Take 325 mg by mouth daily with breakfast.   FLUoxetine  20 MG capsule Commonly known as: PROZAC  Take 20-40 mg by mouth at bedtime.   fluticasone 50 MCG/ACT nasal spray Commonly known as: FLONASE Place 1-2 sprays into both nostrils daily as needed for allergies.   gabapentin  300 MG capsule Commonly known as: NEURONTIN  Take 600 mg by mouth 2 (two) times daily.   HYDROcodone -acetaminophen  5-325 MG tablet Commonly known as: NORCO/VICODIN Take 1 tablet by mouth every 4 (four) hours as needed for moderate pain (pain score 4-6). What changed:  how much to take when to take this   levofloxacin  750 MG tablet Commonly known as: Levaquin  Take 1 tablet (750 mg total) by mouth every other day for 2 days. Start taking on: February 18, 2024   losartan  50 MG tablet Commonly known as: COZAAR  Take 50 mg by mouth daily.   omeprazole 20 MG capsule Commonly known as: PRILOSEC Take 20 mg by mouth daily.   ondansetron  4 MG tablet Commonly known as: ZOFRAN  Take 1 tablet (4 mg total) by mouth every 6 (six) hours as needed for nausea.   ondansetron  8 MG disintegrating tablet Commonly known as: ZOFRAN -ODT Take 8 mg by mouth every 8 (eight) hours as needed for nausea or vomiting.   oxybutynin  5 MG tablet Commonly known as: DITROPAN  Take 5 mg by mouth 2 (two) times daily.   potassium chloride  SA 20 MEQ tablet Commonly known as: KLOR-CON  M Take 20 mEq by mouth daily.        Discharge Exam: Filed Weights   02/13/24 1139  Weight: 90.7 kg   Cardiovascular: Regular rate and rhythm, no murmurs / rubs / gallops. No extremity edema. 2+ pedal pulses. No carotid bruits.  Abdomen: Soft, no tenderness, Bowel sounds positive.  Foley catheter in  place Musculoskeletal: no clubbing / cyanosis. Good ROM, no contractures. Normal muscle tone.  Skin: No wound noted to the buttocks area Neurologic: CN 2-12 grossly intact. Sensation intact, No focal deficit identified Psychiatric: Alert and oriented x 3. Normal mood.   Condition at discharge: good  The results of significant diagnostics from this hospitalization (including imaging, microbiology, ancillary and laboratory) are listed below for reference.   Imaging Studies: No results found.  Microbiology: Results for orders placed or performed during the hospital encounter of 02/13/24  Urine Culture     Status: Abnormal   Collection Time: 02/13/24  5:16 PM   Specimen: Urine, Random  Result Value Ref Range Status   Specimen Description   Final    URINE, RANDOM Performed at [facility], [address], [place], [place] [address]    Special Requests   Final    NONE Reflexed  from [id number] Performed at [facility], [address] [place], [place] [address]    Culture (A)  Final    10,000 COLONIES/mL KLEBSIELLA AEROGENES 50,000 COLONIES/mL ENTEROCOCCUS FAECIUM    Report Status 02/17/2024 FINAL  Final   Organism ID, Bacteria KLEBSIELLA AEROGENES (A)  Final   Organism ID, Bacteria ENTEROCOCCUS FAECIUM (A)  Final      Susceptibility   Klebsiella aerogenes - MIC*    CEFEPIME  0.5 SENSITIVE Sensitive     CEFTRIAXONE  >=64 RESISTANT Resistant     CIPROFLOXACIN  <=0.06 SENSITIVE Sensitive     GENTAMICIN <=1 SENSITIVE Sensitive     NITROFURANTOIN 64 INTERMEDIATE Intermediate     TRIMETH/SULFA <=20 SENSITIVE Sensitive     PIP/TAZO Value in next row Resistant      >=128 RESISTANTThis is a modified FDA-approved test that has been validated and its performance characteristics determined by the reporting laboratory.  This laboratory is certified under the Clinical Laboratory Improvement Amendments CLIA as qualified to perform high complexity clinical laboratory testing.     MEROPENEM Value in next row Sensitive      >=128 RESISTANTThis is a modified FDA-approved test that has been validated and its performance characteristics determined by the reporting laboratory.  This laboratory is certified under the Clinical Laboratory Improvement Amendments CLIA as qualified to perform high complexity clinical laboratory testing.    * 10,000 COLONIES/mL KLEBSIELLA AEROGENES   Enterococcus faecium - MIC*    AMPICILLIN Value in next row Resistant      >=128 RESISTANTThis is a modified FDA-approved test that has been validated and its performance characteristics determined by the reporting laboratory.  This laboratory is certified under the Clinical Laboratory Improvement Amendments CLIA as qualified to perform high complexity clinical laboratory testing.    NITROFURANTOIN Value in next row Intermediate      >=128 RESISTANTThis is a modified FDA-approved test that has been validated and its performance characteristics determined by the reporting laboratory.  This laboratory is certified under the Clinical Laboratory Improvement Amendments CLIA as qualified to perform high complexity clinical laboratory testing.    VANCOMYCIN Value in next row Sensitive      >=128 RESISTANTThis is a modified FDA-approved test that has been validated and its performance characteristics determined by the reporting laboratory.  This laboratory is certified under the Clinical Laboratory Improvement Amendments CLIA as qualified to perform high complexity clinical laboratory testing.    * 50,000 COLONIES/mL ENTEROCOCCUS FAECIUM  Blood culture (routine x 2)     Status: None (Preliminary result)   Collection Time: 02/13/24 10:37 PM   Specimen: Left Antecubital; Blood  Result Value Ref Range Status   Specimen Description LEFT ANTECUBITAL  Final   Special Requests   Final    BOTTLES DRAWN AEROBIC ONLY Blood Culture adequate volume   Culture   Final    NO GROWTH 4 DAYS Performed at [facility],  [address] [place], [place] [address]    Report Status PENDING  Incomplete  Blood culture (routine x 2)     Status: None (Preliminary result)   Collection Time: 02/13/24 10:39 PM   Specimen: BLOOD LEFT ARM  Result Value Ref Range Status   Specimen Description BLOOD LEFT ARM  Final   Special Requests   Final    BOTTLES DRAWN AEROBIC ONLY Blood Culture adequate volume   Culture   Final    NO GROWTH 4 DAYS Performed at [facility], [address] [place], [place] [address]  Report Status PENDING  Incomplete    Labs: CBC: Recent Labs  Lab 02/13/24 1141 02/13/24 2237 02/14/24 0449 02/16/24 0410  WBC 12.6* 15.8* 14.0* 7.8  NEUTROABS  --   --   --  4.8  HGB 12.5 10.6* 10.2* 9.8*  HCT 38.2 32.4* 31.0* 31.0*  MCV 92.7 92.8 92.5 94.5  PLT 324 283 262 267   Basic Metabolic Panel: Recent Labs  Lab 02/13/24 1141 02/13/24 2237 02/14/24 0449 02/16/24 0410  NA 134*  --  136 139  K 4.7  --  4.6 4.4  CL 102  --  105 106  CO2 17*  --  20* 25  GLUCOSE 131*  --  109* 116*  BUN 49*  --  39* 21  CREATININE 2.01* 1.66* 1.56* 1.32*  CALCIUM 9.6  --  8.7* 8.6*   Liver Function Tests: Recent Labs  Lab 02/13/24 1141 02/14/24 0449  AST 16 12*  ALT <5 <5  ALKPHOS 108 87  BILITOT 0.6 0.5  PROT 8.4* 6.8  ALBUMIN  3.8 3.2*   CBG: No results for input(s): GLUCAP in the last 168 hours.  Discharge time spent:  38 minutes.  Signed: Drue ONEIDA Potter, MD [facility] 02/17/2024 "

## 2024-02-17 NOTE — Plan of Care (Signed)

## 2024-02-18 LAB — CULTURE, BLOOD (ROUTINE X 2)
Culture: NO GROWTH
Culture: NO GROWTH
Special Requests: ADEQUATE
Special Requests: ADEQUATE
